# Patient Record
Sex: Female | Born: 1935 | Race: White | Hispanic: No | Marital: Married | Smoking: Never smoker
Health system: Southern US, Community
[De-identification: ages and names within clinical notes are randomized; demographics above are authoritative.]

## PROBLEM LIST (undated history)

## (undated) DIAGNOSIS — K219 Gastro-esophageal reflux disease without esophagitis: Secondary | ICD-10-CM

## (undated) DIAGNOSIS — Z531 Procedure and treatment not carried out because of patient's decision for reasons of belief and group pressure: Secondary | ICD-10-CM

## (undated) DIAGNOSIS — M1990 Unspecified osteoarthritis, unspecified site: Secondary | ICD-10-CM

## (undated) DIAGNOSIS — E785 Hyperlipidemia, unspecified: Secondary | ICD-10-CM

## (undated) DIAGNOSIS — I10 Essential (primary) hypertension: Secondary | ICD-10-CM

## (undated) DIAGNOSIS — N952 Postmenopausal atrophic vaginitis: Secondary | ICD-10-CM

## (undated) DIAGNOSIS — N3281 Overactive bladder: Secondary | ICD-10-CM

## (undated) DIAGNOSIS — IMO0001 Reserved for inherently not codable concepts without codable children: Secondary | ICD-10-CM

## (undated) HISTORY — DX: Unspecified osteoarthritis, unspecified site: M19.90

## (undated) HISTORY — PX: LUMBAR SPINE SURGERY: SHX701

## (undated) HISTORY — DX: Gastro-esophageal reflux disease without esophagitis: K21.9

## (undated) HISTORY — PX: VEIN LIGATION AND STRIPPING: SHX2653

## (undated) HISTORY — PX: COLONOSCOPY: SHX174

---
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
## 2000-03-23 ENCOUNTER — Encounter: Admission: RE | Admit: 2000-03-23 | Discharge: 2000-03-23 | Payer: Self-pay | Admitting: Otolaryngology

## 2000-03-23 ENCOUNTER — Encounter: Payer: Self-pay | Admitting: Otolaryngology

## 2002-07-25 ENCOUNTER — Other Ambulatory Visit: Admission: RE | Admit: 2002-07-25 | Discharge: 2002-07-25 | Payer: Self-pay | Admitting: Obstetrics and Gynecology

## 2002-11-26 ENCOUNTER — Encounter: Admission: RE | Admit: 2002-11-26 | Discharge: 2002-11-26 | Payer: Self-pay

## 2004-07-18 ENCOUNTER — Ambulatory Visit (HOSPITAL_COMMUNITY): Admission: RE | Admit: 2004-07-18 | Discharge: 2004-07-18 | Payer: Self-pay | Admitting: Oral Surgery

## 2004-09-27 ENCOUNTER — Other Ambulatory Visit: Admission: RE | Admit: 2004-09-27 | Discharge: 2004-09-27 | Payer: Self-pay | Admitting: Obstetrics and Gynecology

## 2005-06-21 ENCOUNTER — Encounter: Admission: RE | Admit: 2005-06-21 | Discharge: 2005-06-21 | Payer: Self-pay | Admitting: Obstetrics and Gynecology

## 2005-09-14 ENCOUNTER — Ambulatory Visit: Payer: Self-pay | Admitting: Ophthalmology

## 2005-10-02 ENCOUNTER — Ambulatory Visit: Payer: Self-pay | Admitting: Ophthalmology

## 2006-01-10 ENCOUNTER — Ambulatory Visit: Payer: Self-pay | Admitting: Otolaryngology

## 2007-06-25 ENCOUNTER — Encounter: Payer: Self-pay | Admitting: Orthopedic Surgery

## 2007-07-08 ENCOUNTER — Encounter: Payer: Self-pay | Admitting: Orthopedic Surgery

## 2007-08-07 ENCOUNTER — Encounter: Payer: Self-pay | Admitting: Orthopedic Surgery

## 2007-09-07 ENCOUNTER — Encounter: Payer: Self-pay | Admitting: Orthopedic Surgery

## 2010-02-14 ENCOUNTER — Emergency Department: Payer: Self-pay | Admitting: Internal Medicine

## 2010-12-07 ENCOUNTER — Emergency Department: Payer: Self-pay | Admitting: Emergency Medicine

## 2011-06-05 ENCOUNTER — Encounter: Payer: Self-pay | Admitting: Internal Medicine

## 2011-06-05 ENCOUNTER — Ambulatory Visit (INDEPENDENT_AMBULATORY_CARE_PROVIDER_SITE_OTHER): Payer: MEDICARE | Admitting: Internal Medicine

## 2011-06-05 VITALS — BP 136/75 | HR 88 | Temp 97.3°F | Ht 67.0 in | Wt 176.1 lb

## 2011-06-05 DIAGNOSIS — L03019 Cellulitis of unspecified finger: Secondary | ICD-10-CM

## 2011-06-05 NOTE — Assessment & Plan Note (Signed)
I do believe the Pseudomonas is colonization and not real based on no fever, no significant progression of cellulitis and that it has not developed an abscess.  I advised her that she may respond better to antifungal agents and was advised to continue with her ketaconazole.  I do not feel further antibacterials are of any benefit.  If it persists, I suggested she may need oral therapy with terbinfine, which also would be useful for her toes, but at this time, she prefers to continue with topical therapy. She was advised of signs of bacterial infection including significant erythema,edema and pain and spread or abscess formation.

## 2011-06-05 NOTE — Progress Notes (Signed)
  Subjective:    Patient ID: Kendra Flores, female    DOB: [birth year], 77 y.o.   MRN: [id number]  HPI patient with history of chronic fungal infections of toes and now feet and recent nail culture with Pseudomonas here for consultation regarding whether or not this constitutes colonization.  The patient reports many years of toe onchomycosis and recent chronic paronychia.  The patient reports washing her hands very frequently and recently has noted a fingernail infection in what appears to be an ingrown nail. I believe part of this nail is what was cultured and has grown several bacterial specimens and the patient has been on several courses of antibiotics.  She reports no fevers and some relief with ketaconazole.  Her finger does bother her and has been swollen.  This has been going on for several weeks.  No history of any immunodeficiency issues or diabetes.  She is otherwise active and healthy.     Review of Systems  Constitutional: Negative.   HENT: Negative.   Eyes: Negative.   Respiratory: Negative.   Cardiovascular: Negative.   Gastrointestinal: Negative.   Musculoskeletal: Negative.   Skin:       + recent rash after taking prednisone  Hematological: Bruises/bleeds easily.       Objective:   Physical Exam  Constitutional: She is oriented to person, place, and time. She appears well-developed and well-nourished.  Cardiovascular: Normal rate, regular rhythm and normal heart sounds.   No murmur heard. Pulmonary/Chest: Effort normal and breath sounds normal. No respiratory distress. She has no wheezes.  Neurological: She is alert and oriented to person, place, and time.  Skin: Skin is warm and dry.       Toenails with onchomycosis Fingernail ingrown, some fungal appearing changes, mild erythema.  Psychiatric: She has a normal mood and affect. Her behavior is normal.          Assessment & Plan:   No problem-specific assessment & plan notes found for this encounter.

## 2012-02-05 ENCOUNTER — Encounter: Payer: Self-pay | Admitting: Neurology

## 2012-03-29 ENCOUNTER — Ambulatory Visit (INDEPENDENT_AMBULATORY_CARE_PROVIDER_SITE_OTHER): Payer: MEDICARE | Admitting: Neurology

## 2012-03-29 ENCOUNTER — Encounter: Payer: Self-pay | Admitting: Neurology

## 2012-03-29 VITALS — BP 140/80 | HR 100 | Wt 184.0 lb

## 2012-03-29 DIAGNOSIS — M549 Dorsalgia, unspecified: Secondary | ICD-10-CM

## 2012-03-29 DIAGNOSIS — M48061 Spinal stenosis, lumbar region without neurogenic claudication: Secondary | ICD-10-CM

## 2012-03-29 MED ORDER — PREGABALIN 25 MG PO CAPS: 25.0000 mg | ORAL_CAPSULE | Freq: Three times a day (TID) | ORAL | Status: DC

## 2012-03-29 MED ORDER — DIAZEPAM 5 MG PO TABS: 2.5000 mg | ORAL_TABLET | Freq: Every day | ORAL | Status: AC

## 2012-03-29 NOTE — Patient Instructions (Signed)
Your MRI is scheduled for Monday, June 3rd at 12:45am at [facility] [address]  Please arrive by 12:15pm.  [phone]

## 2012-03-29 NOTE — Progress Notes (Signed)
Dear Dr. Harrington Challenger,  Thank you for having me see Kendra Flores in consultation today at [facility] for her problem with bilateral lower extremity pain.  As you may recall, she is a 78 y.o. year old female with a history of lumbar spine surgery for stenosis(2008), arthritis of the hips, cervical spondylosis, who presents with bilateral leg pain for several years.  It started after her lumbar spine surgery.  She says the pain is worse when walking, and is typically bilateral involving her thighs but radiates to her lower legs at times.  She says it is worse with movement, but does not endorse change with coughing.  She also gets a separate right upper leg pain.  She describes the pain as dull and aching, and does not endorse numbness or tingling.  She has had no change in her bladder function.  She has had a number of interventions, including anesthetic injection into her hip joint, which helped her pain immensely but only lasted days.  Steroid injection into the hip joint was not as successful.  She apparently has had injections into the back, the details of these are not known.  She has been tried on gabapentin, but this made her not feel well.  She has not been on Elavil or Lyrica.  She said she did have an EMG/NCS after her lumbar spine surgery,but does not know the results, and is pretty certain she did not have her current symptoms at the time.   She is unwilling to undergo EMG/NCS because of the pain it causes.  She also does not want to undergo further surgery.  She is using Excedrin and tramadol for the pain.  She was given Celecoxib but has not used it.  She has been seen by a pain clinic in the past, but this was for her migraine headaches.  She has not seen a pain doctor for her currently leg pains.  She also complains of neck pain and numbness in her arms, but she feels this is largely well controlled.  Past Medical History  Diagnosis Date  . Arthritis   . GERD (gastroesophageal  reflux disease)     No past surgical history on file.  History   Social History  . Marital Status: Married    Spouse Name: N/A    Number of Children: N/A  . Years of Education: N/A   Social History Main Topics  . Smoking status: Never Smoker   . Smokeless tobacco: Never Used  . Alcohol Use: No  . Drug Use: No  . Sexually Active: Yes -- Female partner(s)   Other Topics Concern  . None   Social History Narrative  . None    No family history on file.  Current Outpatient Prescriptions on File Prior to Visit  Medication Sig Dispense Refill  . aspirin 81 MG tablet Take 81 mg by mouth daily.        . fluticasone (FLONASE) 50 MCG/ACT nasal spray Place 2 sprays into the nose daily.      Marland Kitchen ketoconazole (NIZORAL) 2 % cream Apply 1 application topically daily.        . mupirocin (BACTROBAN) 2 % ointment Apply 1 application topically 2 (two) times daily.        Marland Kitchen omeprazole (PRILOSEC) 20 MG capsule Take 20 mg by mouth daily.        . TRAMADOL HCL PO Take by mouth.        . pregabalin (LYRICA) 25 MG capsule Take 1  capsule (25 mg total) by mouth 3 (three) times daily.  90 capsule  3    Allergies  Allergen Reactions  . Ciprofloxacin   . Strawberry       ROS:  13 systems were reviewed and are notable for no change in bladder function.  She also has significant problems sleeping because of leg pain, but does get some relief from 2.5mg  qhs of Valium which she takes intermittently.  All other review of systems are unremarkable.   Examination:  Filed Vitals:   03/29/12 0818  BP: 140/80  Pulse: 100  Weight: 184 lb (83.462 kg)     In general, uncomfortable women.  Cardiovascular: The patient has a regular rate and rhythm and no carotid bruits.  Fundoscopy:  Disks are flat. Vessel caliber within normal limits.  Mental status:   The patient is oriented to person, place and time. Recent and remote memory are intact. Attention span and concentration are normal. Language  including repetition, naming, following commands are intact. Fund of knowledge of current and historical events, as well as vocabulary are normal.  Cranial Nerves: Pupils are equally round and reactive to light. Visual fields full to confrontation. Extraocular movements are intact without nystagmus. Facial sensation and muscles of mastication are intact. Muscles of facial expression are symmetric. Hearing intact to bilateral finger rub. Tongue protrusion, uvula, palate midline.  Shoulder shrug intact  Motor:  The patient has normal bulk and tone, no pronator drift.  There are no adventitious movements.  5/5 muscle strength bilaterally.  Reflexes:   Biceps  Triceps Brachioradialis Knee Ankle  Right 3+  3+  3+   3+ 2+  Left  3+  3+  3+   3+ 2+  Toes down  Coordination:  Normal finger to nose.  No dysdiadokinesia.  Sensation was check in the lower extremities.  Perhaps a stocking loss to pin prick, decreased vibration, but no clear dermatomal pattern to her groin.  Gait and Station are antalgic.  Romberg is negative.   Impression/Recommendations:  1.  Leg pain/back pain - The big question is what are the generators of this pain.  Her response to an anesthetic injection into the right hip does suggest this may be part of the problem.  However, her history of lumbar stenosis s/p surgery suggests that she may be continuing to have problems with her lumbar spine, possibly causing a radiculopathy.  Normally I would get an EMG/NCS of her lower extremities, and then proceed to an MRI L-spine.  However, she is unwilling to get an EMG.  I will get an MRI L-spine to make sure there are no signs of nerve entrapment.  I have started her on a very low dose of Lyrica due to her known medication intolerances.  She can increase this to 25mg  tid.  I will also give her valium 2.5 mg qhs for sleep.   We will see the patient back in 3 months.  Thank you for having [place] see Kendra Flores in consultation.   Feel free to contact me with any questions.  Kendra Raider Modesto Charon, MD [facility], [place] [address] [place], [place] [address] Phone: [phone] Fax: [phone].

## 2012-04-01 DIAGNOSIS — M48061 Spinal stenosis, lumbar region without neurogenic claudication: Secondary | ICD-10-CM | POA: Insufficient documentation

## 2012-04-02 ENCOUNTER — Telehealth: Payer: Self-pay

## 2012-04-02 NOTE — Telephone Encounter (Signed)
Message copied by Lelon Huh on Tue Apr 02, 2012 11:50 AM ------      Message from: Milas Gain      Created: Mon Apr 01, 2012 12:15 PM       Quanda Pavlicek,            Could you let the [facility] know there is no MRI on the disks they gave me.  Therefore they are going to need to get the operative note for the hardware they had implanted before we can get a repeat MRI.            Matt

## 2012-04-02 NOTE — Telephone Encounter (Signed)
Pt aware, she will contact surgeon and have them fax a note to [place] regarding what type of hardware she has.

## 2012-04-08 ENCOUNTER — Ambulatory Visit
Admission: RE | Admit: 2012-04-08 | Discharge: 2012-04-08 | Disposition: A | Discharge status: Home / Self Care | Payer: Medicare Other | Source: Ambulatory Visit | Attending: Neurology | Admitting: Neurology

## 2012-04-08 DIAGNOSIS — M549 Dorsalgia, unspecified: Secondary | ICD-10-CM

## 2012-04-15 ENCOUNTER — Telehealth: Payer: Self-pay | Admitting: Neurology

## 2012-04-15 NOTE — Telephone Encounter (Signed)
The patient came by the office to pick up her medical records. Informed of the MRI results at that time. She also signed a release of information so that we could obtain notes re: ESI in her back by Dr. Mila Palmer at [facility].

## 2012-04-15 NOTE — Telephone Encounter (Signed)
see results note that I wrote.

## 2012-04-15 NOTE — Progress Notes (Signed)
Patient came by the office to pick up med records. Informed of the MRI results at that time. She also signed a release of information so that we could get notes from Dr. Fredonia Highland re: ESI to her back.

## 2012-04-15 NOTE — Telephone Encounter (Signed)
Pt would like results of MRI. She also states that Lyrica made her depressed and she doesn't want to take it. The 2.5 mg of Valium at night to sleep as needed is working. Pt would like a verbal report of MRI results as well as a hard copy sent to her. She asked about a CD as well, advised pt to contact [facility] regarding the actual disk.

## 2012-04-16 ENCOUNTER — Telehealth: Payer: Self-pay | Admitting: Neurology

## 2012-04-16 NOTE — Telephone Encounter (Signed)
Spoke with the patient. Information given as directed by Dr. Modesto Charon below. The patient was encouraged to follow up with her PCP re: additional imaging that may be needed. The patient understands. She also has a copy of the MRI report.

## 2012-04-16 NOTE — Telephone Encounter (Signed)
Message copied by Benay Spice on Tue Apr 16, 2012  9:07 AM ------      Message from: Milas Gain      Created: Tue Apr 16, 2012  8:41 AM       Hate to bother you with this. But it looks like Ms. Uecker has a cyst on her right kidney that I forgot to mention - these are commonly seen and usually benign but she should follow up with her PCP about this to see if they want to get any other imaging.

## 2012-06-05 ENCOUNTER — Ambulatory Visit (INDEPENDENT_AMBULATORY_CARE_PROVIDER_SITE_OTHER): Payer: Medicare Other | Admitting: Neurology

## 2012-06-05 ENCOUNTER — Encounter: Payer: Self-pay | Admitting: Neurology

## 2012-06-05 VITALS — BP 128/80 | HR 88 | Wt 187.0 lb

## 2012-06-05 DIAGNOSIS — G8929 Other chronic pain: Secondary | ICD-10-CM

## 2012-06-05 DIAGNOSIS — M48061 Spinal stenosis, lumbar region without neurogenic claudication: Secondary | ICD-10-CM

## 2012-06-05 MED ORDER — DIAZEPAM 5 MG PO TABS: 5.0000 mg | ORAL_TABLET | Freq: Every evening | ORAL | Status: AC | PRN

## 2012-06-05 MED ORDER — DIAZEPAM 5 MG PO TABS: 5.0000 mg | ORAL_TABLET | Freq: Four times a day (QID) | ORAL | Status: DC | PRN

## 2012-06-05 NOTE — Progress Notes (Signed)
Dear Dr. Harrington Challenger,  I saw  Kendra Flores back in [facility] clinic for her problem with bilateral lower extremity pain.  As you may recall, she is a 78 y.o. year old female with a history of lumbar spine surgery for stenosis(2008), arthritis of the hips, cervical spondylosis, who presents with bilateral leg pain for several years. It started after her lumbar spine surgery.   At her last visit, I ordered an MRI of her L-spine.  This revealed her previous decompression but also L2-L3 right mild to moderate foraminal stenosis and possible left L5 nerve root encroachment.  She tried the Lyrica 25mg  one night, but it made her feel too depressed the next day.  Apparently she has seen the [facility] in the past, with no relief.  She gets some relief from acupuncture.  She finds the Valium 2.5-5mg  qhs I prescribed helps her sleep.  She continues to get intermittent shooting pain down her right or left leg.  It sounds like the left leg pain does go to the foot at times.  She also gets lower extremity pain that she attributes to her sclerotherapy that she thinks is separate.  She refused to get a NCS or EMG.  She feels overall however her pain is better.   Medical history, social history, and family history were reviewed and have not changed since the last clinic visit.  Current Outpatient Prescriptions on File Prior to Visit  Medication Sig Dispense Refill  . brimonidine (ALPHAGAN P) 0.1 % SOLN       . fluticasone (FLONASE) 50 MCG/ACT nasal spray Place 2 sprays into the nose daily.      Marland Kitchen ketoconazole (NIZORAL) 2 % cream Apply 1 application topically daily.        . metaxalone (SKELAXIN) 800 MG tablet Take 800 mg by mouth. prn      . mupirocin (BACTROBAN) 2 % ointment Apply 1 application topically 2 (two) times daily.        Marland Kitchen omeprazole (PRILOSEC) 20 MG capsule Take 20 mg by mouth daily.        . TRAMADOL HCL PO Take by mouth.          Allergies  Allergen Reactions  .  Ciprofloxacin   . Strawberry     ROS:  13 systems were reviewed and are notable for chronic hip and neck pain as well.  All other review of systems are unremarkable.  Exam: . Filed Vitals:   06/05/12 1103  BP: 128/80  Pulse: 88  Weight: 187 lb (84.823 kg)    In general, well appearing women.    Motor:  Normal bulk and tone, no drift and 5/5 muscle strength bilaterally.  Reflexes:  Biceps Triceps Brachioradialis Knee Ankle  Right 3+ 3+ 3+ 3+ 2+  Left 3+ 3+ 3+ 3+ 2+   Gait:  Normal gait and station.  Romberg negative.  Impression/Recommendations:  1.  Shooting leg pain - likely related to her bilateral possible radiculopathies.  I offered Pamelor but she is not willing to try this at this time.  I think returning to the [facility] for perhaps another [facility] with the additional information from her MRI L-spine may be helpful. 2.  Difficulty sleeping - I have refilled her Valium 2.5->5mg  qhs.  This is likely acting as a muscle relaxant as well.   I will be leaving [facility] to join the Epilepsy Section at [facility] so I am going to let her follow up  with you.  Kendra Raider Modesto Charon, MD [facility], [place]

## 2013-07-14 ENCOUNTER — Ambulatory Visit: Payer: Self-pay | Admitting: Orthopedic Surgery

## 2013-07-14 LAB — BASIC METABOLIC PANEL
Anion Gap: 3 — ABNORMAL LOW (ref 7–16)
BUN: 21 mg/dL — ABNORMAL HIGH (ref 7–18)
Calcium, Total: 9.9 mg/dL (ref 8.5–10.1)
Chloride: 106 mmol/L (ref 98–107)
Co2: 29 mmol/L (ref 21–32)
Creatinine: 0.75 mg/dL (ref 0.60–1.30)
EGFR (African American): >60
Glucose: 104 mg/dL — ABNORMAL HIGH (ref 65–99)
Osmolality: 279 (ref 275–301)
Potassium: 4.3 mmol/L (ref 3.5–5.1)

## 2013-07-14 LAB — URINALYSIS, COMPLETE
Glucose,UR: NEGATIVE mg/dL (ref 0–75)
Ketone: NEGATIVE
Ph: 5 (ref 4.5–8.0)
Specific Gravity: 1.011 (ref 1.003–1.030)
Squamous Epithelial: 6

## 2013-07-14 LAB — PROTIME-INR
INR: 0.9
Prothrombin Time: 12.4 secs (ref 11.5–14.7)

## 2013-07-14 LAB — CBC
HCT: 40.5 % (ref 35.0–47.0)
HGB: 13.6 g/dL (ref 12.0–16.0)
MCH: 32 pg (ref 26.0–34.0)
MCV: 95 fL (ref 80–100)

## 2013-07-28 ENCOUNTER — Inpatient Hospital Stay: Payer: Self-pay | Admitting: Orthopedic Surgery

## 2013-07-28 HISTORY — PX: TOTAL HIP ARTHROPLASTY: SHX124

## 2013-07-29 LAB — BASIC METABOLIC PANEL
BUN: 9 mg/dL (ref 7–18)
Chloride: 107 mmol/L (ref 98–107)
Co2: 28 mmol/L (ref 21–32)
Creatinine: 0.69 mg/dL (ref 0.60–1.30)
EGFR (Non-African Amer.): >60
Glucose: 134 mg/dL — ABNORMAL HIGH (ref 65–99)
Osmolality: 278 (ref 275–301)

## 2013-07-29 LAB — HEMOGLOBIN: HGB: 11.8 g/dL — ABNORMAL LOW (ref 12.0–16.0)

## 2013-07-29 LAB — PLATELET COUNT: Platelet: 257 10*3/uL (ref 150–440)

## 2013-07-31 ENCOUNTER — Encounter: Payer: Self-pay | Admitting: Internal Medicine

## 2013-07-31 LAB — PATHOLOGY REPORT

## 2013-08-06 ENCOUNTER — Encounter: Payer: Self-pay | Admitting: Internal Medicine

## 2014-01-29 ENCOUNTER — Ambulatory Visit: Payer: Self-pay | Admitting: Orthopedic Surgery

## 2014-01-29 LAB — URINALYSIS, COMPLETE
BACTERIA: NONE SEEN
BLOOD: NEGATIVE
Bilirubin,UR: NEGATIVE
Glucose,UR: NEGATIVE mg/dL (ref 0–75)
NITRITE: NEGATIVE
PH: 5 (ref 4.5–8.0)
Protein: NEGATIVE
RBC,UR: 6 /HPF (ref 0–5)
Specific Gravity: 1.031 (ref 1.003–1.030)
Squamous Epithelial: 2
WBC UR: 3 /HPF (ref 0–5)

## 2014-01-29 LAB — CBC
HCT: 38.8 % (ref 35.0–47.0)
HGB: 12.4 g/dL (ref 12.0–16.0)
MCH: 31 pg (ref 26.0–34.0)
MCHC: 32.1 g/dL (ref 32.0–36.0)
MCV: 97 fL (ref 80–100)
PLATELETS: 227 10*3/uL (ref 150–440)
RBC: 4.02 10*6/uL (ref 3.80–5.20)
RDW: 14.8 % — ABNORMAL HIGH (ref 11.5–14.5)
WBC: 6 10*3/uL (ref 3.6–11.0)

## 2014-01-29 LAB — BASIC METABOLIC PANEL
Anion Gap: 4 — ABNORMAL LOW (ref 7–16)
BUN: 24 mg/dL — ABNORMAL HIGH (ref 7–18)
CHLORIDE: 108 mmol/L — AB (ref 98–107)
CO2: 29 mmol/L (ref 21–32)
CREATININE: 0.74 mg/dL (ref 0.60–1.30)
Calcium, Total: 9.8 mg/dL (ref 8.5–10.1)
EGFR (African American): >60
EGFR (Non-African Amer.): >60
GLUCOSE: 97 mg/dL (ref 65–99)
Osmolality: 285 (ref 275–301)
Potassium: 3.6 mmol/L (ref 3.5–5.1)
Sodium: 141 mmol/L (ref 136–145)

## 2014-01-29 LAB — MRSA PCR SCREENING

## 2014-01-29 LAB — SEDIMENTATION RATE: ERYTHROCYTE SED RATE: 15 mm/h (ref 0–30)

## 2014-01-29 LAB — APTT: ACTIVATED PTT: 30.9 s (ref 23.6–35.9)

## 2014-01-29 LAB — PROTIME-INR
INR: 1
PROTHROMBIN TIME: 13 s (ref 11.5–14.7)

## 2014-02-12 ENCOUNTER — Inpatient Hospital Stay: Payer: Self-pay | Admitting: Orthopedic Surgery

## 2014-02-12 HISTORY — PX: TOTAL HIP ARTHROPLASTY: SHX124

## 2014-02-13 LAB — BASIC METABOLIC PANEL
Anion Gap: 3 — ABNORMAL LOW (ref 7–16)
BUN: 11 mg/dL (ref 7–18)
CALCIUM: 8.3 mg/dL — AB (ref 8.5–10.1)
CHLORIDE: 108 mmol/L — AB (ref 98–107)
Co2: 28 mmol/L (ref 21–32)
Creatinine: 0.57 mg/dL — ABNORMAL LOW (ref 0.60–1.30)
EGFR (African American): >60
GLUCOSE: 120 mg/dL — AB (ref 65–99)
OSMOLALITY: 278 (ref 275–301)
Potassium: 3.9 mmol/L (ref 3.5–5.1)
Sodium: 139 mmol/L (ref 136–145)

## 2014-02-13 LAB — HEMOGLOBIN: HGB: 10.8 g/dL — ABNORMAL LOW (ref 12.0–16.0)

## 2014-02-13 LAB — PLATELET COUNT: Platelet: 177 10*3/uL (ref 150–440)

## 2014-02-14 LAB — BASIC METABOLIC PANEL
ANION GAP: 3 — AB (ref 7–16)
BUN: 11 mg/dL (ref 7–18)
Calcium, Total: 8.2 mg/dL — ABNORMAL LOW (ref 8.5–10.1)
Chloride: 109 mmol/L — ABNORMAL HIGH (ref 98–107)
Co2: 28 mmol/L (ref 21–32)
Creatinine: 0.54 mg/dL — ABNORMAL LOW (ref 0.60–1.30)
EGFR (African American): >60
Glucose: 117 mg/dL — ABNORMAL HIGH (ref 65–99)
OSMOLALITY: 280 (ref 275–301)
Potassium: 3.4 mmol/L — ABNORMAL LOW (ref 3.5–5.1)
SODIUM: 140 mmol/L (ref 136–145)

## 2014-02-14 LAB — HEMOGLOBIN: HGB: 9.7 g/dL — ABNORMAL LOW (ref 12.0–16.0)

## 2014-02-15 LAB — POTASSIUM: Potassium: 4.1 mmol/L (ref 3.5–5.1)

## 2014-02-15 LAB — HEMOGLOBIN: HGB: 9.7 g/dL — ABNORMAL LOW (ref 12.0–16.0)

## 2014-02-16 LAB — PATHOLOGY REPORT

## 2015-01-01 ENCOUNTER — Emergency Department: Payer: Self-pay | Admitting: Emergency Medicine

## 2015-01-29 HISTORY — PX: INGUINAL HERNIA REPAIR: SUR1180

## 2015-02-26 NOTE — Discharge Summary (Signed)
PATIENT NAME:  Kendra Flores, PARMA MR#:  [id number] DATE OF BIRTH:  [birth year]  DATE OF ADMISSION:  07/28/2013 DATE OF DISCHARGE:  07/31/2013  ADMITTING DIAGNOSIS: Right hip severe osteoarthritis.   DISCHARGE DIAGNOSIS: Right hip severe osteoarthritis.   PROCEDURE: Right total hip replacement, right anterior approach.   ANESTHESIA: General.   SURGEON: Leitha Schuller, M.D.   ASSISTANT: Cranston Neighbor, PA-C.   ESTIMATED BLOOD LOSS: 200 mL, which was filtered back and given through Cell Saver.   SPECIMEN: Removed femoral head with extensive wear.  IMPLANTS: Medacta Amis 4 stem with a 52 Versafit cup, DM with corresponding liner and a M-28 mm head.   CONDITION: To recovery room stable.   HISTORY: The patient is a 81 year old female who has been active. She does have a history of prior multiple level lumbar decompression by Dr. Barbie Banner at [facility] in 2008. She has had intermittent leg pain related to this. She has been seeing Dr. Yves Dill for the last few years after referral to him. She has had hip injections as well as back treatment. She has gotten to the point now where her hip has been debilitating. She has had significant progression of her osteoarthritis. She brings in copies of CDs performed from 2010 which show significant osteoarthritis, but there is maintenance of the joint space. Additionally, she reports pain at night that wakes her up with severe pain that causes her to scream. The right hip is more severe than the left, and she does have significant left hip pain and swelling.   PHYSICAL EXAMINATION: HEENT: Remarkable for upper dental appliances.  LUNGS: Clear.  HEART: Regular rate and rhythm.  RIGHT HIP: On exam she is proximally 0 degrees of internal rotation bilaterally, 10 degrees external rotation with severe pain. She has flexion contracture of about 20 degrees bilaterally. Distally she is neurovascularly intact. She is intact about the hip.   HOSPITAL COURSE: The  patient was admitted to the hospital on 07/28/2013. She had surgery that same day and was brought to the orthopedic floor from the PAC-U in stable condition. On postop day 1, the patient's hemoglobin was stable and vital signs were stable. She had slow progress with physical therapy. On postop day 2, she continued to have slow progress with physical therapy, labs and vital signs remained stable, and by postop day 3 she continued to be stable medically, but having slow progress with physical therapy so she was discharged to rehab facility for continuation of physical therapy and occupational therapy.   CONDITION AT DISCHARGE: Stable.   DISCHARGE INSTRUCTIONS: The patient may gradually increase weight-bearing on the affected extremity. She is to wear thigh-high TED hose on both legs and remove 1 hour per 8 hour shift. Use incentive spirometry every hour while awake and encourage cough and deep breathing. No concentrated sweets or sugar. Apply an ice pack to the affected area. Do not get the dressing or bandage wet or dirty. Call [facility] ortho if the dressing gets water under it. Leave the dressing on. Call [facility] ortho if any of the following occur: Bright red bleeding from the incision or wound, fever above 101.5 degrees, redness, swelling or drainage at the incision. Call [facility] ortho if you experience any increased leg pain, numbness or weakness and/or bowel or bladder symptoms. Physical therapy and occupational therapy has been ordered. Call [facility] ortho for follow-up appointment in 2 weeks.   DISCHARGE MEDICATIONS: Continue home medications at discharge and then hospital medications. Home care  will be: OxyContin extended release 50 mg oral tablet 1 tab every 12 hours, oxycodone 5 mg oral tablet 1 to 2 tabs orally every 4 to 6 hours as needed for pain, tramadol 50 mg oral tablet 1 to 2 tabs orally every 6 hours as needed for pain, and an aspirin 81 mg oral delayed release  tablet 1 tablet orally once a day.  ____________________________ T. Cranston Neighborhris Gaines, PA-C tcg:sb D: 07/31/2013 07:37:08 ET T: 07/31/2013 08:07:34 ET JOB#: [id number]  cc: T. Cranston Neighborhris Gaines, PA-C, <Dictator> Evon SlackHOMAS C GAINES GeorgiaPA ELECTRONICALLY SIGNED 08/13/2013 9:06

## 2015-02-26 NOTE — Op Note (Signed)
PATIENT NAME:  Kendra Flores, [name] MR#:  [id number] DATE OF BIRTH:  01-16-1932  DATE OF PROCEDURE:  07/28/2013  PREOPERATIVE DIAGNOSIS: Severe osteoarthritis, right hip.   POSTOPERATIVE DIAGNOSIS: Severe osteoarthritis, right hip.   PROCEDURE: Right total hip replacement, direct anterior approach.   ANESTHESIA: General.   SURGEON: Leitha SchullerMichael J. Daisy Lites, M.D.   ASSISTANT: Cranston Neighborhris Gaines, PA-C.   DESCRIPTION OF PROCEDURE: The patient was brought to the operating room and after adequate spinal anesthesia was obtained, the patient was transferred to the operative table with the Medacta. Left leg on a well-padded table, right foot in the Medacta attachment and traction applied. Anterior C-arm views were obtained with slight traction, trying to restore normal anatomy radiographically. The hip was then prepped and draped in the usual sterile manner. Appropriate patient identification and timeout procedures were completed. A direct anterior approach was made with incision centered over the greater trochanter in line with the tensor fascia muscle. Incision was carried down through the skin and subcutaneous tissue. The patient is a TEFL teacherJehovah's Witness and extreme care was taken to obtain hemostasis throughout the procedure, Cell Saver being utilized as well. The tensor fascia was incised and the TFL retracted laterally. Deep fascia was then incised and the lateral femoral circumflex vessels were fairly small, were identified and cauterized. The rectus was retracted slightly medially and the anterior capsule exposed. A capsulotomy was created and the femoral neck cut carried out. The head was removed and noted to have extreme wear present with essentially no cartilage. There was extensive wear of the acetabulum as well. The labrum was excised and reaming was carried out to 52 mm, which gave good bleeding bone. A 52 mm trial was stable and a 52 cup was impacted without difficulty. The leg was externally rotated and the  pubofemoral and ischiofemoral release was carried out to mobilize the proximal femur. The proximal canal was opened with a box osteotome followed by broaches up to a size 4. The 4 seemed to give a tight fit and was chosen and trials were made off of this. The final stem was impacted with a medium head and a corresponding DM liner to the 52 mm cup. The hip was reduced and was very stable to 90 degrees rotation. Leg length was same as preop with traction, felt to be the appropriate length for the patient. The wound was thoroughly irrigated. Local anesthetic was infiltrated in the pericapsular tissue, 0.5% Sensorcaine without epinephrine. The tensor fascia was repaired using  running quill suture followed by a subcutaneous drain, 2-0 quill subcutaneously followed by skin staples, Xeroform, 4 x 4's, ABD, tape, and the patient was sent to the recovery room in stable condition.   ESTIMATED BLOOD LOSS: 200 mL, which was filtered back, given back through Cell Saver.   SPECIMEN: Removed femoral head with extensive wear.   IMPLANTS: Medacta AMIS 4 stem with a 52 Versafitcup DM with corresponding liner and an M28 mm head.   CONDITION: To recovery room, stable.    ____________________________ Leitha SchullerMichael J. Dara Beidleman, MD mjm:jm D: 07/28/2013 18:14:15 ET T: 07/28/2013 21:19:09 ET JOB#: [id number]  cc: Leitha SchullerMichael J. Ivyanna Sibert, MD, <Dictator> Leitha SchullerMICHAEL J Yossef Gilkison MD ELECTRONICALLY SIGNED 07/28/2013 23:37

## 2015-02-27 NOTE — Discharge Summary (Signed)
PATIENT NAME:  Kendra Flores, Kendra Flores MR#:  [id number] DATE OF BIRTH:  Apr 01, 1932  DATE OF ADMISSION:  02/12/2014 DATE OF DISCHARGE:  02/16/2014  ADMITTING DIAGNOSIS: Severe left hip osteoarthritis.   DISCHARGE DIAGNOSIS: Severe left hip osteoarthritis.   PROCEDURE: Left total hip replacement.   ANESTHESIA: General.   SURGEON: Leitha Schuller, M.D.   ESTIMATED BLOOD LOSS: 325 with 165 reinfusion from Cell Saver.   SPECIMEN: Removed femoral head.   COMPLICATIONS: None.   IMPLANTS:  Medacta AMIStem size 4, 54 mm Versafitcup DM with associated liner and an L 28 mm head.   CONDITION: To recovery room stable.   HISTORY: The patient is a 81 year old female who had a successful right total hip replacement in the past. She has had severe left hip osteoarthritis. The pain has been severe and limiting her activities of daily living. Pain is located in her groin and radiates into the top of the knee. She had severe pain with rotation. She has agreed and consented to a left total hip replacement.   PHYSICAL EXAMINATION: GENERAL: Well-developed, well-nourished female in no apparent distress. Normal affect. Antalgic component to left lower extremity.  LEFT HIP: Examination of the left hip shows the patient has severe pain with internal rotation. She has stiffness with internal and external rotation, strength is 4 to 5 with hip adduction and abduction. She has 5/5 strength with knee flexion and extension and ankle plantarflexion and dorsiflexion,  HEART: Regular rate and rhythm.  LUNGS: Clear to auscultation bilaterally. No wheezes, rales or rhonchi.  HEENT: Head normocephalic, atraumatic. Pupils equal, round and reactive to light.   HOSPITAL COURSE: The patient was admitted to the hospital on 02/12/2014. She had surgery that same day and was brought to the orthopedic floor from the PACU in stable condition. On postop day 1, the patient had acute postop blood loss anemia with a hemoglobin of 10.8. On  postoperative day 2, this trended down to 9.7 and was stable again on postop day 3 at 9.7. The patient had slow progress with physical therapy and by postop day 4, she had progressed well, but was still having difficulty getting in and out of the bed, as well as with getting to the bathroom. She had no assistance at home, so it was thought that she would be more safe if she  went to a rehab facility for PT and OT.   CONDITION AT DISCHARGE: Stable.   DISCHARGE INSTRUCTIONS: The patient may gradually increase weight-bearing on the affected extremity. Elevate the leg, apply TED hose on both legs and remove when arising the next morning. Elevate the heels off the bed, use incentive spirometer every hour while awake. I encouraged cough and deep breathing. The patient may resume a regular diet as tolerated. Apply an ice pack to the affected area. Do not get the dressing or bandage wet or dirty. Call [facility] ortho if  the dressing gets water under it. Leave the dressing on. Call [facility] ortho if any of the following occur: Bright red bleeding from the incision wound, fever above 101.5 degrees, redness, swelling or drainage at the incision. Call [facility] ortho if you experience any increased leg pain, numbness or weakness in the legs or bowel or bladder symptoms. Home physical therapy has been arranged for continuation of rehab program. Please call [facility] ortho if a therapist has not contacted you within 48 hours of your return home. Call [facility]  ortho for followup appointment at [phone].   Please see discharge medications from  discharge instructions.   ____________________________ T. Cranston Neighborhris Lucie Friedlander, PA-C tcg:dmm D: 02/16/2014 12:46:03 ET T: 02/16/2014 13:09:48 ET JOB#: [id number]  cc: T. Cranston Neighborhris Rye Dorado, PA-C, <Dictator> Evon SlackHOMAS C Kayonna Lawniczak GeorgiaPA ELECTRONICALLY SIGNED 02/27/2014 8:07

## 2015-02-27 NOTE — Op Note (Signed)
PATIENT NAME:  Kendra Flores, Kendra Flores DATE OF BIRTH:  [birth year]  DATE OF PROCEDURE:  02/12/2014  PREOPERATIVE DIAGNOSIS: Severe left hip osteoarthritis.   POSTOPERATIVE DIAGNOSIS:  Severe left hip osteoarthritis.   PROCEDURE: Left total hip replacement.   ANESTHESIA: General.   SURGEON:  Leitha SchullerMichael J. Quetzaly Ebner, MD    DESCRIPTION OF PROCEDURE: The patient was brought to the operating room, and after adequate anesthesia was obtained, the patient was placed on the operative table with the right leg on a well-padded table, left foot in the Medacta boot attachment. C-arm was brought in and good visualization of the proximal femur could be obtained on the AP and printed off.  Next, the hip was prepped and draped using the standard fashion. Appropriate patient identification and timeout procedures were completed. Direct anterior approach was made centered over the greater trochanter. The incision was carried down through the skin and subcutaneous tissue with hemostasis being achieved with electrocautery. The tensor fascia was incised and the tensor muscle retracted laterally. Deep retractor placed in the lateral femoral circumflex. Vessels ligated with minimal bleeding present. Next, the anterior capsule was exposed and a flap created for placement of the Charnley retractor, but first, Hohmann retractors placed around the femoral neck and a femoral neck cut carried out, then the head removed. There was severe degenerative change present to the femoral head. The labrum was excised along with extensive scar tissue within the acetabulum which had extensive wear. Reaming was carried out to 54 mm, at which point there was good bleeding bone. The 54 trial fit well and the 54 Versafitcup DM was then impacted into place. Following this, the leg was externally rotated.  Pubofemoral and ischiofemoral ligaments released and the leg was brought into external rotation and the foot dropped into extension. Sequential  broaching was carried out for the femur up to a size  which seemed to give a good fill.  Next, the trials were placed, and after appropriate length was determined based on comparison to preop x-ray, the #4 stem was impacted with an L head, 28 mm head, and the liner for the 54 cup.  The hip was reduced and appeared stable, appropriate component position. The wound was thoroughly irrigated and closed with a heavy quill suture for the deep fascia with 30 mL of 0.25% Sensorcaine with epinephrine infiltrated first. A subcutaneous drain was placed, followed by subcuticular quill suture and skin staples. Xeroform, 4 x 4's, ABDs, and tape were applied.   ESTIMATED BLOOD LOSS: 325, with 165 reinfused from Cell Saver.   SPECIMEN: Removed femoral head.   COMPLICATIONS: None.   IMPLANTS: Medacta AMIstem size 4, 54 mm Versafitcup DM with associated liner and an L 28 mm head.   CONDITION: To recovery room, stable.     ____________________________ Leitha SchullerMichael J. Miracle Criado, MD mjm:dd D: 02/12/2014 21:06:39 ET T: 02/12/2014 23:06:36 ET JOB#: [id number]  cc: Leitha SchullerMichael J. Colston Pyle, MD, <Dictator> Leitha SchullerMICHAEL J Akshay Spang MD ELECTRONICALLY SIGNED 02/13/2014 7:54

## 2015-05-03 IMAGING — CR DG C-ARM 61-120 MIN
4 series · 4 of 4 positions shown · non-contrast
Comparison: None

CLINICAL DATA: Hip arthroplasty.

EXAM:
DG C-ARM 61-120 MIN
:

[cont. (1 of 4)]
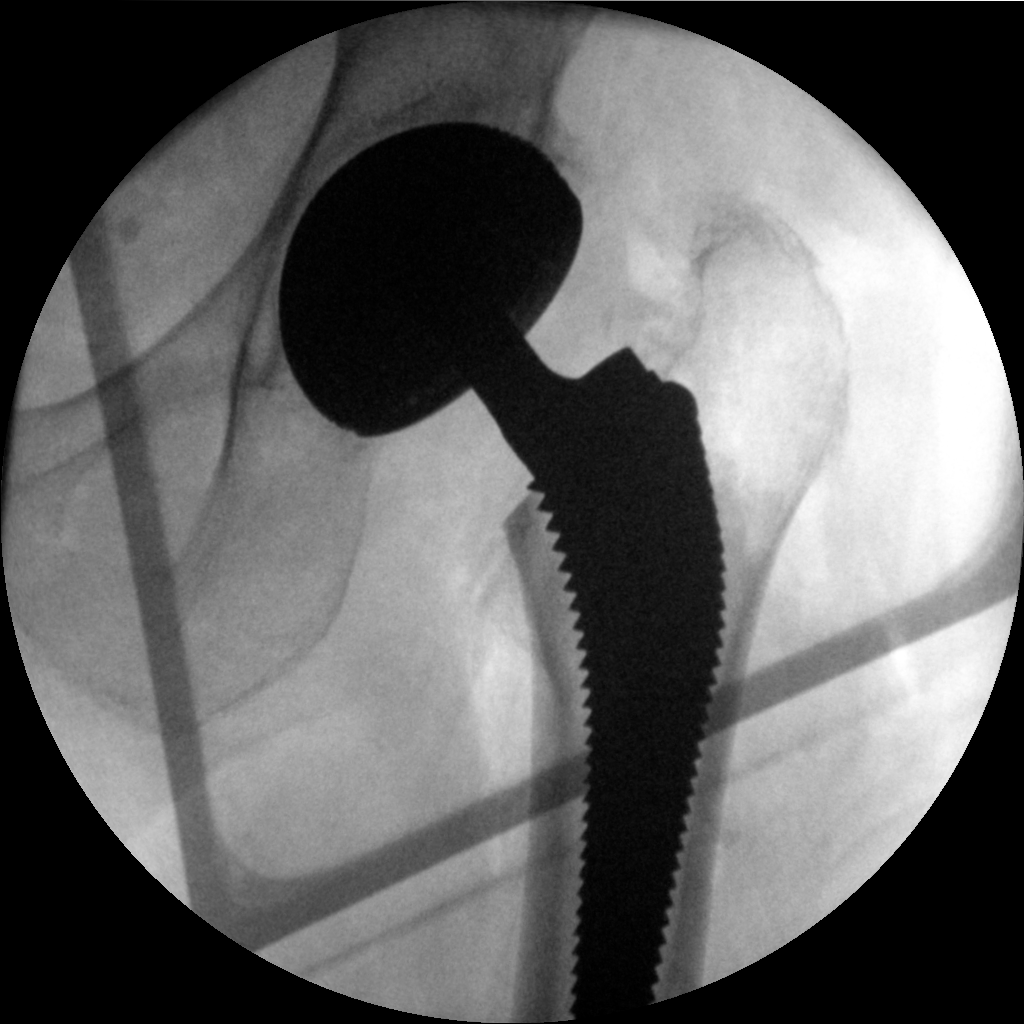

[cont. (2 of 4)]
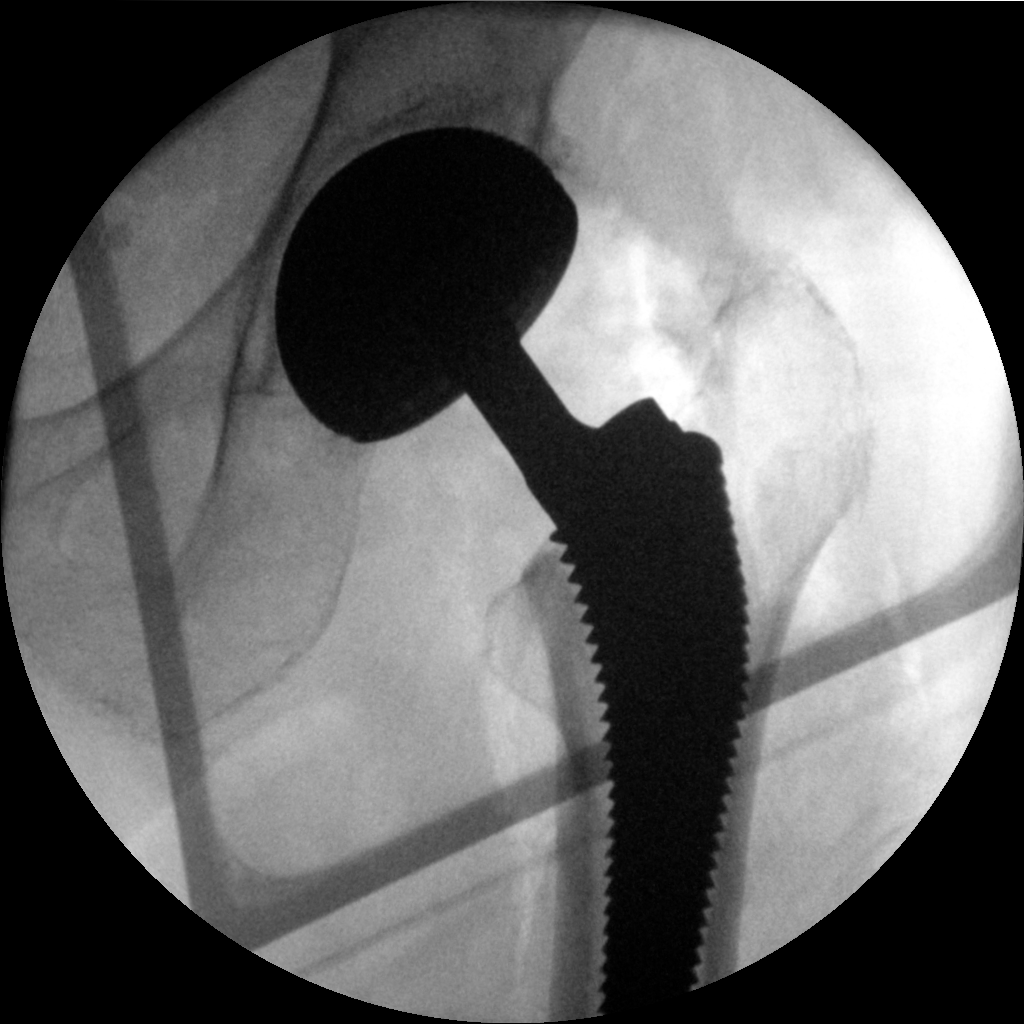

[cont. (3 of 4)]
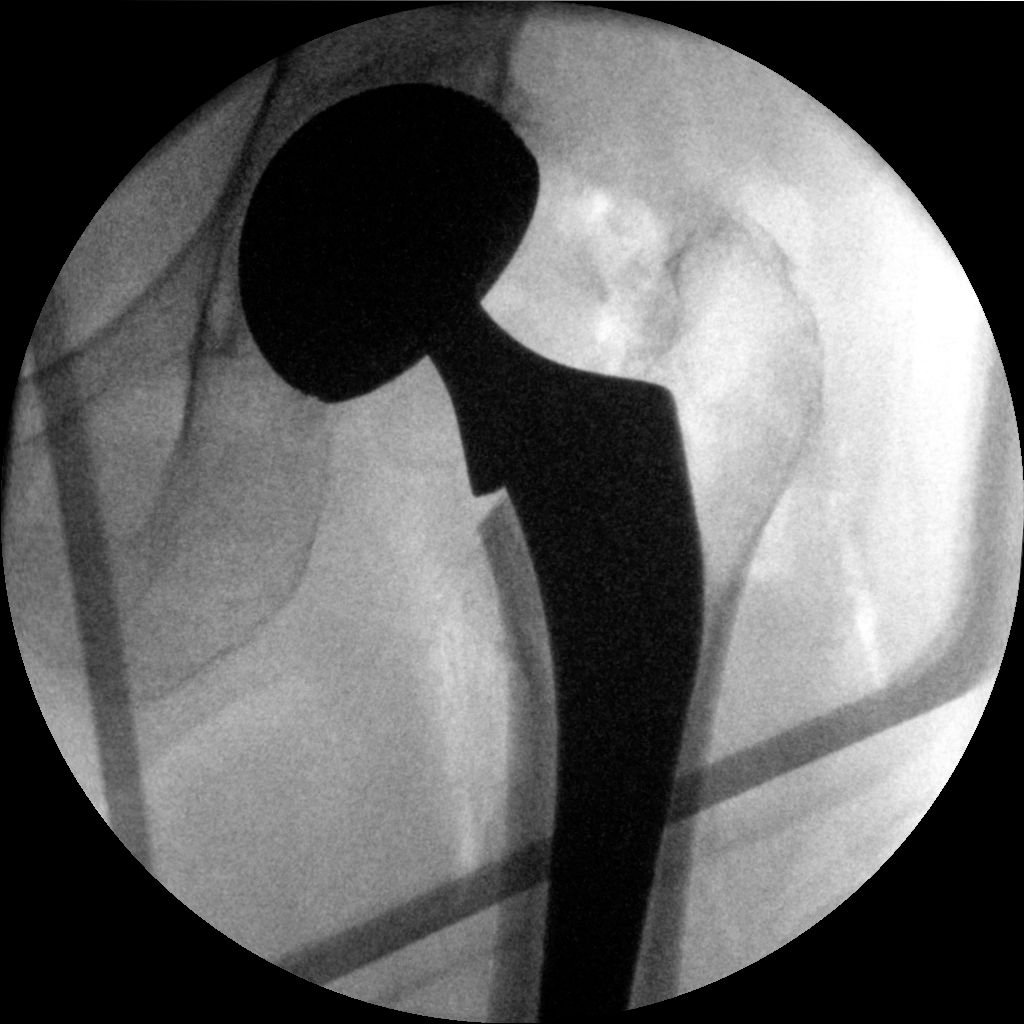

[cont. (4 of 4)]
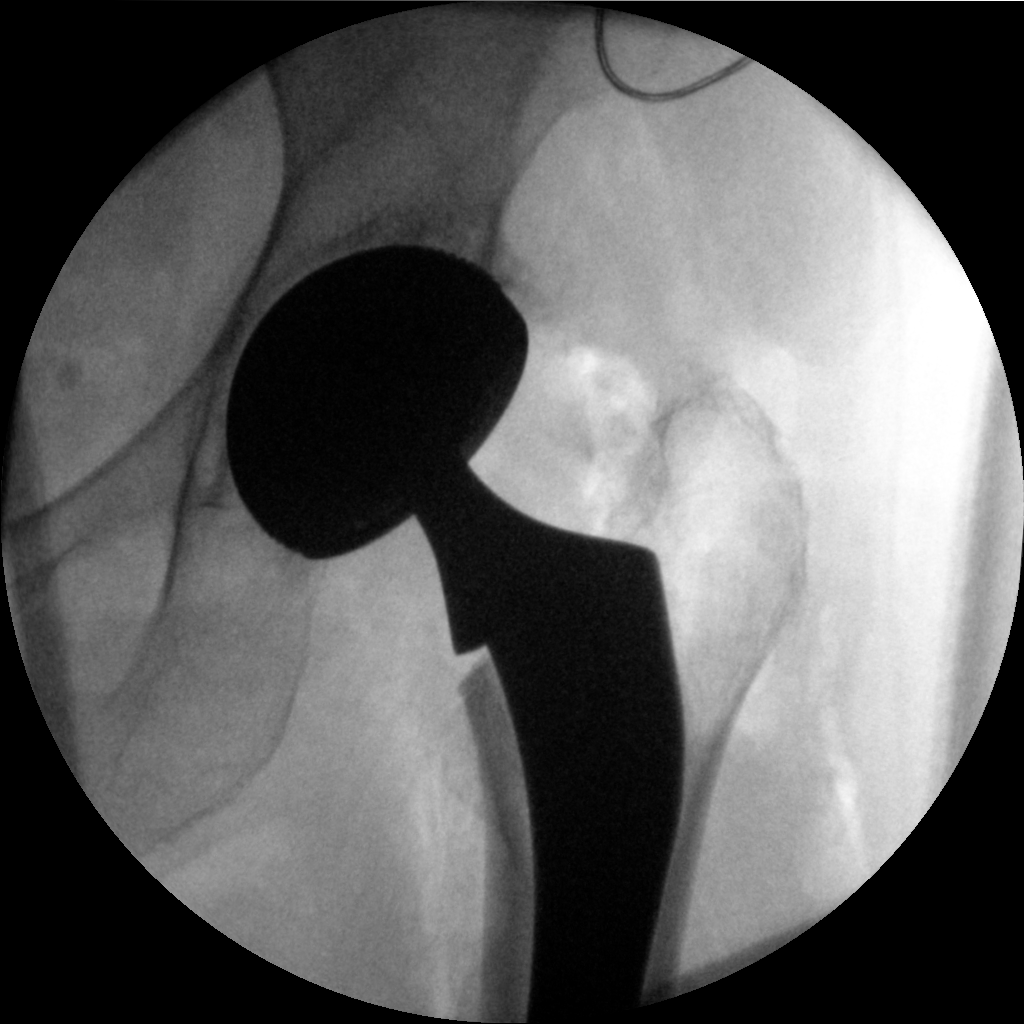

[4 of 4 positions shown; findings below may reference images not displayed]

FINDINGS: Left hip prosthesis appears well seated and aligned on the 4
submitted images. No acute fracture or evidence of an operative
complication.
IMPRESSION: Fluoroscopy for left hip arthroplasty. Please refer to the procedure
report for complete description.

## 2015-10-24 ENCOUNTER — Emergency Department: Payer: PPO

## 2015-10-24 ENCOUNTER — Emergency Department
Admission: EM | Admit: 2015-10-24 | Discharge: 2015-10-24 | Disposition: A | Discharge status: Home / Self Care | Payer: PPO | Attending: Student | Admitting: Student

## 2015-10-24 DIAGNOSIS — Z79899 Other long term (current) drug therapy: Secondary | ICD-10-CM | POA: Insufficient documentation

## 2015-10-24 DIAGNOSIS — Z792 Long term (current) use of antibiotics: Secondary | ICD-10-CM | POA: Diagnosis not present

## 2015-10-24 DIAGNOSIS — R55 Syncope and collapse: Secondary | ICD-10-CM | POA: Diagnosis not present

## 2015-10-24 DIAGNOSIS — N390 Urinary tract infection, site not specified: Secondary | ICD-10-CM | POA: Diagnosis not present

## 2015-10-24 DIAGNOSIS — Z7951 Long term (current) use of inhaled steroids: Secondary | ICD-10-CM | POA: Insufficient documentation

## 2015-10-24 LAB — URINALYSIS COMPLETE WITH MICROSCOPIC (ARMC ONLY)
Bilirubin Urine: NEGATIVE
GLUCOSE, UA: NEGATIVE mg/dL
HGB URINE DIPSTICK: NEGATIVE
KETONES UR: NEGATIVE mg/dL
Nitrite: NEGATIVE
PH: 6 (ref 5.0–8.0)
Protein, ur: NEGATIVE mg/dL
SPECIFIC GRAVITY, URINE: 1.013 (ref 1.005–1.030)

## 2015-10-24 LAB — CBC
HEMATOCRIT: 38.3 % (ref 35.0–47.0)
Hemoglobin: 12.7 g/dL (ref 12.0–16.0)
MCH: 32.5 pg (ref 26.0–34.0)
MCHC: 33.2 g/dL (ref 32.0–36.0)
MCV: 98.1 fL (ref 80.0–100.0)
PLATELETS: 191 10*3/uL (ref 150–440)
RBC: 3.91 MIL/uL (ref 3.80–5.20)
RDW: 14.9 % — AB (ref 11.5–14.5)
WBC: 7.7 10*3/uL (ref 3.6–11.0)

## 2015-10-24 LAB — BASIC METABOLIC PANEL
Anion gap: 7 (ref 5–15)
BUN: 26 mg/dL — AB (ref 6–20)
CHLORIDE: 109 mmol/L (ref 101–111)
CO2: 24 mmol/L (ref 22–32)
CREATININE: 0.71 mg/dL (ref 0.44–1.00)
Calcium: 9.2 mg/dL (ref 8.9–10.3)
GFR calc Af Amer: >60 mL/min (ref >60)
GFR calc non Af Amer: >60 mL/min (ref >60)
GLUCOSE: 129 mg/dL — AB (ref 65–99)
POTASSIUM: 4.1 mmol/L (ref 3.5–5.1)
SODIUM: 140 mmol/L (ref 135–145)

## 2015-10-24 LAB — GLUCOSE, CAPILLARY: GLUCOSE-CAPILLARY: 118 mg/dL — AB (ref 65–99)

## 2015-10-24 LAB — HEPATIC FUNCTION PANEL
ALBUMIN: 3.7 g/dL (ref 3.5–5.0)
ALT: 17 U/L (ref 14–54)
AST: 26 U/L (ref 15–41)
Alkaline Phosphatase: 46 U/L (ref 38–126)
BILIRUBIN INDIRECT: 0.6 mg/dL (ref 0.3–0.9)
Bilirubin, Direct: 0.2 mg/dL (ref 0.1–0.5)
TOTAL PROTEIN: 6.2 g/dL — AB (ref 6.5–8.1)
Total Bilirubin: 0.8 mg/dL (ref 0.3–1.2)

## 2015-10-24 LAB — TROPONIN I

## 2015-10-24 MED ORDER — CEPHALEXIN 500 MG PO CAPS: 500.0000 mg | ORAL_CAPSULE | Freq: Four times a day (QID) | ORAL | Status: DC

## 2015-10-24 NOTE — ED Notes (Signed)
[place] EMS from church after near syncopable episode.  Pt c/o being nauseated numerous times. Was given zofran 4mg  IV by EMS and 250cc NS as well.  Pt reports not feeling well this morning while sitting in church and felt faint.  Pt reports when she woke up she was feeling fine.

## 2015-10-24 NOTE — ED Provider Notes (Signed)
[facility] Emergency Department Provider Note  ____________________________________________  Time seen: Approximately 12:05 PM  I have reviewed the triage vital signs and the nursing notes.   HISTORY  Chief Complaint Near Syncope and Nausea    HPI Kendra Flores is a 81 y.o. female with history of GERD and arthritis who presents for evaluation of syncope today, and gradual onset just prior to arrival, now resolved. The patient reports that she awoke this morning feeling nauseated. She was sitting in her church service when her nausea worsened, she gradually started to feel flushed and lightheaded, told her husband "I think I'm going to faint" and then proceeded to have a syncopal episode. She did not fall or hit her head. She had no chest pain or difficulty breathing. She's been nauseated without vomiting. No diarrhea. No abdominal pain. She has fainted in the past and was told previously it may have been due to a low blood pressure. Currently her nausea is severe but she has no other complaints. There are no modifying factors.    Past Medical History  Diagnosis Date  . Arthritis   . GERD (gastroesophageal reflux disease)     Patient Active Problem List   Diagnosis Date Noted  . Lumbar stenosis 04/01/2012  . Paronychia of finger 06/05/2011    No past surgical history on file.  Current Outpatient Rx  Name  Route  Sig  Dispense  Refill  . aspirin-acetaminophen-caffeine (EXCEDRIN MIGRAINE) 250-250-65 MG per tablet   Oral   Take 2 tablets by mouth. Takes up to 5 a day         . brimonidine (ALPHAGAN P) 0.1 % SOLN               . fluticasone (FLONASE) 50 MCG/ACT nasal spray   Nasal   Place 2 sprays into the nose daily.         Marland Kitchen ketoconazole (NIZORAL) 2 % cream   Topical   Apply 1 application topically daily.           . metaxalone (SKELAXIN) 800 MG tablet   Oral   Take 800 mg by mouth. prn         . mupirocin (BACTROBAN) 2 %  ointment   Topical   Apply 1 application topically 2 (two) times daily.           Marland Kitchen omeprazole (PRILOSEC) 20 MG capsule   Oral   Take 20 mg by mouth daily.           . TRAMADOL HCL PO   Oral   Take by mouth.             Allergies Ciprofloxacin and Strawberry extract  No family history on file.  Social History Social History  Substance Use Topics  . Smoking status: Never Smoker   . Smokeless tobacco: Never Used  . Alcohol Use: No    Review of Systems Constitutional: No fever/chills Eyes: No visual changes. ENT: No sore throat. Cardiovascular: Denies chest pain. Respiratory: Denies shortness of breath. Gastrointestinal: No abdominal pain.  + nausea, no vomiting.  No diarrhea.  No constipation. Genitourinary: Negative for dysuria. Musculoskeletal: Negative for back pain. Skin: Negative for rash. Neurological: Negative for headaches, focal weakness or numbness.  10-point ROS otherwise negative.  ____________________________________________   PHYSICAL EXAM:  VITAL SIGNS: ED Triage Vitals  Enc Vitals Group     BP 10/24/15 1111 126/64 mmHg     Pulse Rate 10/24/15 1049 58  Resp 10/24/15 1049 12     Temp 10/24/15 1049 97.5 F (36.4 C)     Temp Source 10/24/15 1049 Oral     SpO2 10/24/15 1049 98 %     Weight 10/24/15 1049 180 lb (81.647 kg)     Height 10/24/15 1049 5\' 6"  (1.676 m)     Head Cir --      Peak Flow --      Pain Score 10/24/15 1051 0     Pain Loc --      Pain Edu? --      Excl. in GC? --     Constitutional: Alert and oriented. Well appearing and in no acute distress. Eyes: Conjunctivae are normal. PERRL. EOMI. Head: Atraumatic. Nose: No congestion/rhinnorhea. Mouth/Throat: Mucous membranes are moist.  Oropharynx non-erythematous. Neck: No stridor.   Cardiovascular: Normal rate, regular rhythm. Grossly normal heart sounds.  Good peripheral circulation. Respiratory: Normal respiratory effort.  No retractions. Lungs  CTAB. Gastrointestinal: Soft and nontender. No distention. No CVA tenderness. Genitourinary: deferred Musculoskeletal: No lower extremity tenderness nor edema.  No joint effusions. Neurologic:  Normal speech and language. No gross focal neurologic deficits are appreciated. No gait instability. 5 out of 5 strength in bilateral upper and lower extremities, sensation intact to light touch throughout. Skin:  Skin is warm, dry and intact. No rash noted. Psychiatric: Mood and affect are normal. Speech and behavior are normal.  ____________________________________________   LABS (all labs ordered are listed, but only abnormal results are displayed)  Labs Reviewed  BASIC METABOLIC PANEL - Abnormal; Notable for the following:    Glucose, Bld 129 (*)    BUN 26 (*)    All other components within normal limits  CBC - Abnormal; Notable for the following:    RDW 14.9 (*)    All other components within normal limits  URINALYSIS COMPLETEWITH MICROSCOPIC ([facility] ONLY) - Abnormal; Notable for the following:    Color, Urine YELLOW (*)    APPearance HAZY (*)    Leukocytes, UA 3+ (*)    Bacteria, UA FEW (*)    Squamous Epithelial / LPF 0-5 (*)    All other components within normal limits  HEPATIC FUNCTION PANEL - Abnormal; Notable for the following:    Total Protein 6.2 (*)    All other components within normal limits  TROPONIN I  CBG MONITORING, ED   ____________________________________________  EKG  ED ECG REPORT I, Gayla DossGayle, Nashika Coker A, the attending physician, personally viewed and interpreted this ECG.   Date: 10/24/2015  EKG Time: 10:50   Rate: 62  Rhythm: normal EKG, normal sinus rhythm  Axis: normal  Intervals:none  ST&T Change: No acute ST elevation.  ____________________________________________  RADIOLOGY  CXR IMPRESSION: IMPRESSION: Lungs are clear and there is no evidence of acute cardiopulmonary abnormality.   CT head IMPRESSION: No acute abnormality  noted. ____________________________________________   PROCEDURES  Procedure(s) performed: None  Critical Care performed: No  ____________________________________________   INITIAL IMPRESSION / ASSESSMENT AND PLAN / ED COURSE  Pertinent labs & imaging results that were available during my care of the patient were reviewed by me and considered in my medical decision making (see chart for details).  Kendra Flores is a 81 y.o. female with history of GERD and arthritis who presents for evaluation of syncope today, and gradual onset just prior to arrival, now resolved. On exam, she is nontoxic appearing and in no acute distress. Vital signs stable, she is afebrile per she is complaining of nausea  but has not vomited. She has a benign examination as well as an intact neurological examination. Suspect vasovagal syncope given significant prodrome. No chest pain, no palpitations, EKG reassuring, doubt cardiogenic syncope. Intact neurological exam and I doubt purely neurogenic cause of syncope however we'll obtain CT head, screening labs, urinalysis, chest x-ray. Reassess for disposition.  ----------------------------------------- 3:16 PM on 10/24/2015 -----------------------------------------  The patient reports she feels well at this time and is requesting discharge but she is tolerating by mouth intake. Urinalysis is concerning for urinary tract infection, we'll discharge with Keflex. Labs reviewed. CMP and CBC are unremarkable, troponin negative. CT head negative for any acute intercranial process. Chest x-ray negative for any acute cardiopulmonary process. Urine culture sent. We discussed return precautions, need for close PCP follow-up and she is comfortable with the discharge plan. DC home. ____________________________________________   FINAL CLINICAL IMPRESSION(S) / ED DIAGNOSES  Final diagnoses:  UTI (lower urinary tract infection)  Syncope, unspecified syncope type      Gayla Doss, MD 10/24/15 1517

## 2015-10-24 NOTE — ED Notes (Signed)
Patient transported to CT 

## 2015-11-25 DIAGNOSIS — R5383 Other fatigue: Secondary | ICD-10-CM | POA: Diagnosis not present

## 2015-11-25 DIAGNOSIS — R399 Unspecified symptoms and signs involving the genitourinary system: Secondary | ICD-10-CM | POA: Diagnosis not present

## 2015-11-25 DIAGNOSIS — Z13220 Encounter for screening for lipoid disorders: Secondary | ICD-10-CM | POA: Diagnosis not present

## 2015-11-25 DIAGNOSIS — K4090 Unilateral inguinal hernia, without obstruction or gangrene, not specified as recurrent: Secondary | ICD-10-CM | POA: Diagnosis not present

## 2015-11-25 DIAGNOSIS — Z87898 Personal history of other specified conditions: Secondary | ICD-10-CM | POA: Diagnosis not present

## 2015-11-25 DIAGNOSIS — I10 Essential (primary) hypertension: Secondary | ICD-10-CM | POA: Diagnosis not present

## 2015-11-25 DIAGNOSIS — R5381 Other malaise: Secondary | ICD-10-CM | POA: Diagnosis not present

## 2015-11-25 DIAGNOSIS — R300 Dysuria: Secondary | ICD-10-CM | POA: Diagnosis not present

## 2015-11-26 DIAGNOSIS — Z13220 Encounter for screening for lipoid disorders: Secondary | ICD-10-CM | POA: Diagnosis not present

## 2015-11-26 DIAGNOSIS — R399 Unspecified symptoms and signs involving the genitourinary system: Secondary | ICD-10-CM | POA: Diagnosis not present

## 2015-11-26 DIAGNOSIS — I10 Essential (primary) hypertension: Secondary | ICD-10-CM | POA: Diagnosis not present

## 2015-12-01 DIAGNOSIS — E782 Mixed hyperlipidemia: Secondary | ICD-10-CM | POA: Diagnosis not present

## 2015-12-01 DIAGNOSIS — R55 Syncope and collapse: Secondary | ICD-10-CM | POA: Diagnosis not present

## 2015-12-30 DIAGNOSIS — E782 Mixed hyperlipidemia: Secondary | ICD-10-CM | POA: Diagnosis not present

## 2015-12-30 DIAGNOSIS — I10 Essential (primary) hypertension: Secondary | ICD-10-CM | POA: Diagnosis not present

## 2016-01-25 DIAGNOSIS — E782 Mixed hyperlipidemia: Secondary | ICD-10-CM | POA: Diagnosis not present

## 2016-01-25 DIAGNOSIS — I10 Essential (primary) hypertension: Secondary | ICD-10-CM | POA: Diagnosis not present

## 2016-02-02 DIAGNOSIS — H401131 Primary open-angle glaucoma, bilateral, mild stage: Secondary | ICD-10-CM | POA: Diagnosis not present

## 2016-02-29 DIAGNOSIS — I10 Essential (primary) hypertension: Secondary | ICD-10-CM | POA: Diagnosis not present

## 2016-02-29 DIAGNOSIS — E782 Mixed hyperlipidemia: Secondary | ICD-10-CM | POA: Diagnosis not present

## 2016-03-17 DIAGNOSIS — M4807 Spinal stenosis, lumbosacral region: Secondary | ICD-10-CM | POA: Diagnosis not present

## 2016-03-17 DIAGNOSIS — F518 Other sleep disorders not due to a substance or known physiological condition: Secondary | ICD-10-CM | POA: Diagnosis not present

## 2016-03-17 DIAGNOSIS — I10 Essential (primary) hypertension: Secondary | ICD-10-CM | POA: Diagnosis not present

## 2016-03-17 DIAGNOSIS — G4720 Circadian rhythm sleep disorder, unspecified type: Secondary | ICD-10-CM | POA: Diagnosis not present

## 2016-05-10 DIAGNOSIS — R339 Retention of urine, unspecified: Secondary | ICD-10-CM | POA: Diagnosis not present

## 2016-05-10 DIAGNOSIS — N814 Uterovaginal prolapse, unspecified: Secondary | ICD-10-CM | POA: Diagnosis not present

## 2016-05-10 DIAGNOSIS — R350 Frequency of micturition: Secondary | ICD-10-CM | POA: Diagnosis not present

## 2016-05-10 DIAGNOSIS — H6123 Impacted cerumen, bilateral: Secondary | ICD-10-CM | POA: Diagnosis not present

## 2016-05-15 DIAGNOSIS — R350 Frequency of micturition: Secondary | ICD-10-CM | POA: Diagnosis not present

## 2016-05-15 DIAGNOSIS — N812 Incomplete uterovaginal prolapse: Secondary | ICD-10-CM | POA: Diagnosis not present

## 2016-05-15 DIAGNOSIS — N362 Urethral caruncle: Secondary | ICD-10-CM | POA: Diagnosis not present

## 2016-05-15 DIAGNOSIS — N3000 Acute cystitis without hematuria: Secondary | ICD-10-CM | POA: Diagnosis not present

## 2016-06-04 DIAGNOSIS — R358 Other polyuria: Secondary | ICD-10-CM | POA: Diagnosis not present

## 2016-06-04 DIAGNOSIS — R350 Frequency of micturition: Secondary | ICD-10-CM | POA: Diagnosis not present

## 2016-06-16 DIAGNOSIS — N390 Urinary tract infection, site not specified: Secondary | ICD-10-CM | POA: Diagnosis not present

## 2016-06-20 DIAGNOSIS — J302 Other seasonal allergic rhinitis: Secondary | ICD-10-CM | POA: Diagnosis not present

## 2016-06-20 DIAGNOSIS — H6983 Other specified disorders of Eustachian tube, bilateral: Secondary | ICD-10-CM | POA: Diagnosis not present

## 2016-06-20 DIAGNOSIS — R339 Retention of urine, unspecified: Secondary | ICD-10-CM | POA: Diagnosis not present

## 2016-06-20 DIAGNOSIS — N390 Urinary tract infection, site not specified: Secondary | ICD-10-CM | POA: Diagnosis not present

## 2016-06-26 DIAGNOSIS — N819 Female genital prolapse, unspecified: Secondary | ICD-10-CM | POA: Diagnosis not present

## 2016-06-26 DIAGNOSIS — N390 Urinary tract infection, site not specified: Secondary | ICD-10-CM | POA: Diagnosis not present

## 2016-06-26 DIAGNOSIS — R8271 Bacteriuria: Secondary | ICD-10-CM | POA: Diagnosis not present

## 2016-07-21 DIAGNOSIS — N814 Uterovaginal prolapse, unspecified: Secondary | ICD-10-CM | POA: Diagnosis not present

## 2016-07-21 DIAGNOSIS — Z0000 Encounter for general adult medical examination without abnormal findings: Secondary | ICD-10-CM | POA: Diagnosis not present

## 2016-07-21 DIAGNOSIS — Z789 Other specified health status: Secondary | ICD-10-CM | POA: Diagnosis not present

## 2016-07-21 DIAGNOSIS — M4696 Unspecified inflammatory spondylopathy, lumbar region: Secondary | ICD-10-CM | POA: Diagnosis not present

## 2016-07-21 DIAGNOSIS — M160 Bilateral primary osteoarthritis of hip: Secondary | ICD-10-CM | POA: Diagnosis not present

## 2016-07-21 DIAGNOSIS — M4807 Spinal stenosis, lumbosacral region: Secondary | ICD-10-CM | POA: Diagnosis not present

## 2016-07-27 DIAGNOSIS — Z1231 Encounter for screening mammogram for malignant neoplasm of breast: Secondary | ICD-10-CM | POA: Diagnosis not present

## 2016-08-01 DIAGNOSIS — H401131 Primary open-angle glaucoma, bilateral, mild stage: Secondary | ICD-10-CM | POA: Diagnosis not present

## 2016-08-08 DIAGNOSIS — H401131 Primary open-angle glaucoma, bilateral, mild stage: Secondary | ICD-10-CM | POA: Diagnosis not present

## 2016-09-25 DIAGNOSIS — R8271 Bacteriuria: Secondary | ICD-10-CM | POA: Diagnosis not present

## 2016-09-25 DIAGNOSIS — N952 Postmenopausal atrophic vaginitis: Secondary | ICD-10-CM | POA: Diagnosis not present

## 2016-09-25 DIAGNOSIS — N362 Urethral caruncle: Secondary | ICD-10-CM | POA: Diagnosis not present

## 2016-09-25 DIAGNOSIS — N814 Uterovaginal prolapse, unspecified: Secondary | ICD-10-CM | POA: Diagnosis not present

## 2016-11-17 DIAGNOSIS — Z124 Encounter for screening for malignant neoplasm of cervix: Secondary | ICD-10-CM | POA: Diagnosis not present

## 2016-12-28 ENCOUNTER — Ambulatory Visit
Admission: EM | Admit: 2016-12-28 | Discharge: 2016-12-28 | Disposition: A | Discharge status: Home / Self Care | Payer: PPO | Attending: Family Medicine | Admitting: Family Medicine

## 2016-12-28 DIAGNOSIS — N3000 Acute cystitis without hematuria: Secondary | ICD-10-CM

## 2016-12-28 HISTORY — DX: Reserved for inherently not codable concepts without codable children: IMO0001

## 2016-12-28 HISTORY — DX: Overactive bladder: N32.81

## 2016-12-28 HISTORY — DX: Procedure and treatment not carried out because of patient's decision for reasons of belief and group pressure: Z53.1

## 2016-12-28 HISTORY — DX: Hyperlipidemia, unspecified: E78.5

## 2016-12-28 HISTORY — DX: Postmenopausal atrophic vaginitis: N95.2

## 2016-12-28 HISTORY — DX: Essential (primary) hypertension: I10

## 2016-12-28 LAB — URINALYSIS, COMPLETE (UACMP) WITH MICROSCOPIC
BILIRUBIN URINE: NEGATIVE
Glucose, UA: NEGATIVE mg/dL
Ketones, ur: NEGATIVE mg/dL
NITRITE: NEGATIVE
Protein, ur: NEGATIVE mg/dL
SPECIFIC GRAVITY, URINE: 1.015 (ref 1.005–1.030)
pH: 7 (ref 5.0–8.0)

## 2016-12-28 MED ORDER — CEPHALEXIN 500 MG PO CAPS: 500.0000 mg | ORAL_CAPSULE | Freq: Two times a day (BID) | ORAL | 0 refills | Status: AC

## 2016-12-28 NOTE — ED Provider Notes (Signed)
[facility]    CSN: [id number] Arrival date & time: 12/28/16  1512     History   Chief Complaint Chief Complaint  Patient presents with  . Urinary Urgency    HPI Kendra Flores is a 83 y.o. female.   Patient is an 83 year old white female who is here with her husband. Several things were concerned about maintaining she is concerned she may have UTI. Patient really doesn't respond to reck requesting an constantly refers to our ask if we checked her urine and that she's had frequency. She denies any burning and urination.  She's had a lot of trouble with her urine and I'm unable to at this time get a good history on the various problems she's had bladder. Her husband may focus that she's always tired but this is nothing new. When asked us for specific times along the urinary track infection or frequency has been occurring they say a while when asked how long she been fatigue even longer and also while. She's had UTIs before in the past. She is informing me several times [facility] but they came over here. [facility] and was unable to be seen there and he came here.   The history is provided by the patient and the spouse. The history is limited by the condition of the patient. No language interpreter was used.  Urinary Frequency  This is a recurrent problem. The problem has not changed since onset.Pertinent negatives include no chest pain, no abdominal pain, no headaches and no shortness of breath. She has tried nothing for the symptoms.    Past Medical History:  Diagnosis Date  . Arthritis   . Atrophic vaginitis   . GERD (gastroesophageal reflux disease)   . Hyperlipidemia   . Hypertension   . OAB (overactive bladder)   . Refusal of blood transfusions as patient is Jehovah's Witness     Patient Active Problem List   Diagnosis Date Noted  . Lumbar stenosis 04/01/2012  . Paronychia of finger 06/05/2011     Past Surgical History:  Procedure Laterality Date  . COLONOSCOPY    . INGUINAL HERNIA REPAIR Left 01/29/2015  . LUMBAR SPINE SURGERY    . SHOULDER SURGERY Right 1952  . TOTAL HIP ARTHROPLASTY Right 07/28/2013  . TOTAL HIP ARTHROPLASTY Left 02/12/2014  . VEIN LIGATION AND STRIPPING      OB History    No data available       Home Medications    Prior to Admission medications   Medication Sig Start Date End Date Taking? Authorizing Provider  aspirin-acetaminophen-caffeine (EXCEDRIN MIGRAINE) [phone] MG per tablet Take 2 tablets by mouth. Takes up to 5 a day   Yes Historical Provider, MD  brimonidine (ALPHAGAN P) 0.1 % SOLN    Yes Historical Provider, MD  fluticasone (FLONASE) 50 MCG/ACT nasal spray Place 2 sprays into the nose daily.   Yes Historical Provider, MD  ketoconazole (NIZORAL) 2 % cream Apply 1 application topically daily.     Yes Historical Provider, MD  mupirocin (BACTROBAN) 2 % ointment Apply 1 application topically 2 (two) times daily.     Yes Historical Provider, MD  omeprazole (PRILOSEC) 20 MG capsule Take 20 mg by mouth daily.     Yes Historical Provider, MD  TRAMADOL HCL PO Take by mouth.     Yes Historical Provider, MD  cephALEXin (KEFLEX) 500 MG capsule Take 1 capsule (500 mg total)  by mouth 4 (four) times daily. 10/24/15   Gayla Doss, MD  cephALEXin (KEFLEX) 500 MG capsule Take 1 capsule (500 mg total) by mouth 2 (two) times daily. 12/28/16   Hassan Rowan, MD  metaxalone (SKELAXIN) 800 MG tablet Take 800 mg by mouth. prn    Historical Provider, MD    Family History No family history on file.  Social History Social History  Substance Use Topics  . Smoking status: Never Smoker  . Smokeless tobacco: Never Used  . Alcohol use No     Allergies   Azithromycin; Celebrex [celecoxib]; Ciprofloxacin; Codeine; Duloxetine; Hydrocodone; Meloxicam; Monosodium glutamate; Pentazocine; and Strawberry extract   Review of Systems Review of Systems  Unable  to perform ROS: Other  Constitutional: Positive for fatigue.  Respiratory: Negative for shortness of breath.   Cardiovascular: Negative for chest pain.  Gastrointestinal: Negative for abdominal pain.  Genitourinary: Positive for frequency.  Neurological: Negative for headaches.     Physical Exam Triage Vital Signs ED Triage Vitals [12/28/16 1531]  Enc Vitals Group     BP (!) 163/90     Pulse Rate 96     Resp 18     Temp 97.8 F (36.6 C)     Temp Source Oral     SpO2 97 %     Weight 190 lb (86.2 kg)     Height 5\' 5"  (1.651 m)     Head Circumference      Peak Flow      Pain Score      Pain Loc      Pain Edu?      Excl. in GC?    No data found.   Updated Vital Signs BP (!) 163/90 (BP Location: Left Arm)   Pulse 96   Temp 97.8 F (36.6 C) (Oral)   Resp 18   Ht 5\' 5"  (1.651 m)   Wt 190 lb (86.2 kg)   SpO2 97%   BMI 31.62 kg/m   Visual Acuity Right Eye Distance:   Left Eye Distance:   Bilateral Distance:    Right Eye Near:   Left Eye Near:    Bilateral Near:     Physical Exam  Constitutional:  Non-toxic appearance. She does not have a sickly appearance. She does not appear ill. No distress.  Elderly white female appears be some what anxious  HENT:  Head: Normocephalic and atraumatic.  Right Ear: External ear normal.  Left Ear: External ear normal.  Eyes: Pupils are equal, round, and reactive to light.  Neck: Normal range of motion. Neck supple.  Pulmonary/Chest: Effort normal.  Abdominal: Soft. Normal appearance. She exhibits no distension. There is no tenderness. There is no CVA tenderness.  Musculoskeletal: Normal range of motion.  Skin: Skin is warm.  Vitals reviewed.    UC Treatments / Results  Labs (all labs ordered are listed, but only abnormal results are displayed) Labs Reviewed  URINALYSIS, COMPLETE (UACMP) WITH MICROSCOPIC - Abnormal; Notable for the following:       Result Value   Color, Urine STRAW (*)    Hgb urine dipstick TRACE (*)     Leukocytes, UA TRACE (*)    Squamous Epithelial / LPF 0-5 (*)    Bacteria, UA MANY (*)    All other components within normal limits  URINE CULTURE    EKG  EKG Interpretation None       Radiology No results found.  Procedures Procedures (including critical care time)  Medications Ordered in  UC Medications - No data to display  Results for orders placed or performed during the hospital encounter of 12/28/16  Urinalysis, Complete w Microscopic  Result Value Ref Range   Color, Urine STRAW (A) YELLOW   APPearance CLEAR CLEAR   Specific Gravity, Urine 1.015 1.005 - 1.030   pH 7.0 5.0 - 8.0   Glucose, UA NEGATIVE NEGATIVE mg/dL   Hgb urine dipstick TRACE (A) NEGATIVE   Bilirubin Urine NEGATIVE NEGATIVE   Ketones, ur NEGATIVE NEGATIVE mg/dL   Protein, ur NEGATIVE NEGATIVE mg/dL   Nitrite NEGATIVE NEGATIVE   Leukocytes, UA TRACE (A) NEGATIVE   Squamous Epithelial / LPF 0-5 (A) NONE SEEN   WBC, UA 0-5 0 - 5 WBC/hpf   RBC / HPF 0-5 0 - 5 RBC/hpf   Bacteria, UA MANY (A) NONE SEEN   Initial Impression / Assessment and Plan / UC Course  I have reviewed the triage vital signs and the nursing notes.  Pertinent labs & imaging results that were available during my care of the patient were reviewed by me and considered in my medical decision making (see chart for details).   patient will be treated with Keflex since pass an antibiotic she's had before according to records. We'll place on Keflex 500 mg 1 tablet twice a day. Far as fatigue is concerned that persists please follow-up with her PCP. Urine culture also be obtained as well.  Final diagnoses:  Acute cystitis without hematuria    New Prescriptions New Prescriptions   CEPHALEXIN (KEFLEX) 500 MG CAPSULE    Take 1 capsule (500 mg total) by mouth 2 (two) times daily.     Hassan Rowan, MD 12/28/16 [phone]

## 2016-12-28 NOTE — ED Triage Notes (Addendum)
Patient complains of urinary frequency. Patient states that she is unsure of when symptoms started. Patient denies burning.

## 2016-12-31 ENCOUNTER — Telehealth: Payer: Self-pay | Admitting: Internal Medicine

## 2016-12-31 ENCOUNTER — Telehealth: Payer: Self-pay

## 2016-12-31 LAB — URINE CULTURE: SPECIAL REQUESTS: NORMAL

## 2016-12-31 MED ORDER — SULFAMETHOXAZOLE-TRIMETHOPRIM 800-160 MG PO TABS: 1.0000 | ORAL_TABLET | Freq: Two times a day (BID) | ORAL | 0 refills | Status: DC

## 2016-12-31 MED ORDER — SULFAMETHOXAZOLE-TRIMETHOPRIM 800-160 MG PO TABS: 1.0000 | ORAL_TABLET | Freq: Two times a day (BID) | ORAL | 0 refills | Status: AC

## 2016-12-31 NOTE — Telephone Encounter (Signed)
-----   Message from Eustace MooreLaura W Murray, MD sent at 12/31/2016  9:18 AM EST ----- Clinical staff, please let patient know that urine culture was positive for Enterobacter germ, [facility] but sensitive to trimethoprim/sulfa.  Rx trimethoprim/sulfa sent to pharmacy of record, [facility] [place].  Stop cephalexin and take trimethoprim sulfa instead.  Recheck or followup with PCP for further evaluation if symptoms are not improving.  Result note copied to patient's MyChart.  LM

## 2016-12-31 NOTE — Telephone Encounter (Signed)
Left message for patient to call to discuss recent visit and a change in mediation for her.

## 2016-12-31 NOTE — Telephone Encounter (Signed)
Clinical staff, please let patient know that urine culture was positive for Enterobacter germ, resistant [facility] but sensitive to trimethoprim/sulfa.  Rx trimethoprim/sulfa sent to pharmacy of record, [facility] [place].  Stop Cephalexin and take trimethoprim sulfa instead.  Recheck or followup with PCP for further evaluation if symptoms are not improving.  LM

## 2017-01-02 DIAGNOSIS — E782 Mixed hyperlipidemia: Secondary | ICD-10-CM | POA: Diagnosis not present

## 2017-01-02 DIAGNOSIS — M542 Cervicalgia: Secondary | ICD-10-CM | POA: Diagnosis not present

## 2017-01-02 DIAGNOSIS — M160 Bilateral primary osteoarthritis of hip: Secondary | ICD-10-CM | POA: Diagnosis not present

## 2017-01-02 DIAGNOSIS — G4720 Circadian rhythm sleep disorder, unspecified type: Secondary | ICD-10-CM | POA: Diagnosis not present

## 2017-01-02 DIAGNOSIS — R5383 Other fatigue: Secondary | ICD-10-CM | POA: Diagnosis not present

## 2017-01-02 DIAGNOSIS — M4696 Unspecified inflammatory spondylopathy, lumbar region: Secondary | ICD-10-CM | POA: Diagnosis not present

## 2017-01-02 DIAGNOSIS — R5381 Other malaise: Secondary | ICD-10-CM | POA: Diagnosis not present

## 2017-01-02 DIAGNOSIS — R339 Retention of urine, unspecified: Secondary | ICD-10-CM | POA: Diagnosis not present

## 2017-01-02 DIAGNOSIS — I10 Essential (primary) hypertension: Secondary | ICD-10-CM | POA: Diagnosis not present

## 2017-01-02 DIAGNOSIS — N814 Uterovaginal prolapse, unspecified: Secondary | ICD-10-CM | POA: Diagnosis not present

## 2017-01-02 DIAGNOSIS — F518 Other sleep disorders not due to a substance or known physiological condition: Secondary | ICD-10-CM | POA: Diagnosis not present

## 2017-01-11 IMAGING — CR DG CHEST 1V PORT
1 series · 1 of 1 positions shown · non-contrast
Comparison: None.

CLINICAL DATA: Near syncopal episode, nausea.

EXAM:
PORTABLE CHEST 1 VIEW

[ap]
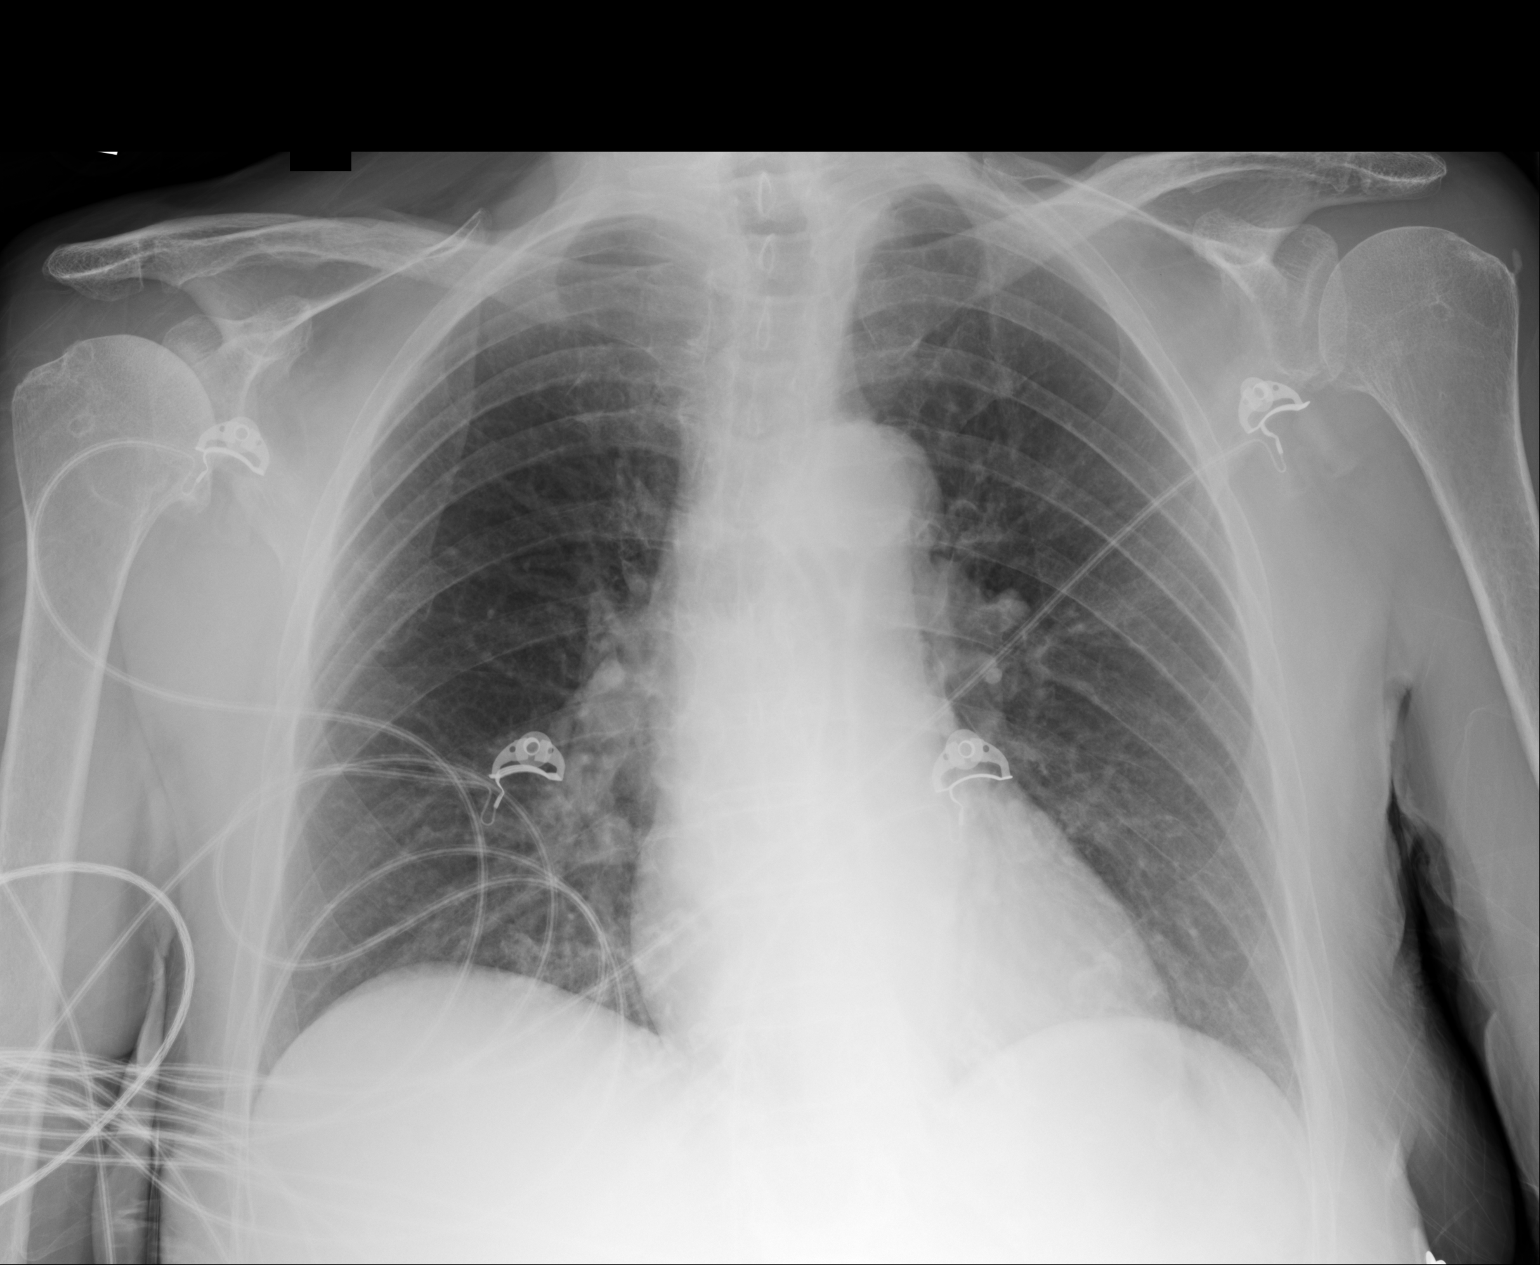

[1 of 1 positions shown; findings below may reference images not displayed]

FINDINGS: The heart size and mediastinal contours are within normal limits.
Both lungs are clear. The visualized skeletal structures are
unremarkable.
IMPRESSION: Lungs are clear and there is no evidence of acute cardiopulmonary
abnormality.

## 2017-01-11 IMAGING — CT CT HEAD W/O CM
1 series · 16 of 30 positions shown, 20 images · non-contrast
Comparison: 12/07/2010

CLINICAL DATA: Near syncopal episode

EXAM:
CT HEAD WITHOUT CONTRAST
TECHNIQUE: Contiguous axial images were obtained from the base of the skull
through the vertex without intravenous contrast.

[Series 2: head wo · axial · 0.42mm/px · z∈[-179,-53]mm · 16 of 32 slices shown, 20 images]
[im 2/32  brain]
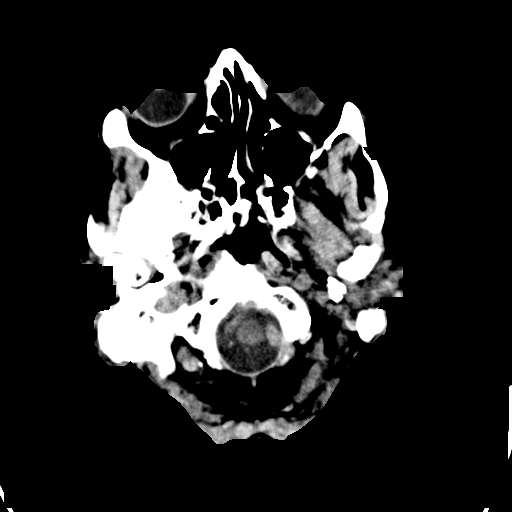
[im 2/32  bone]
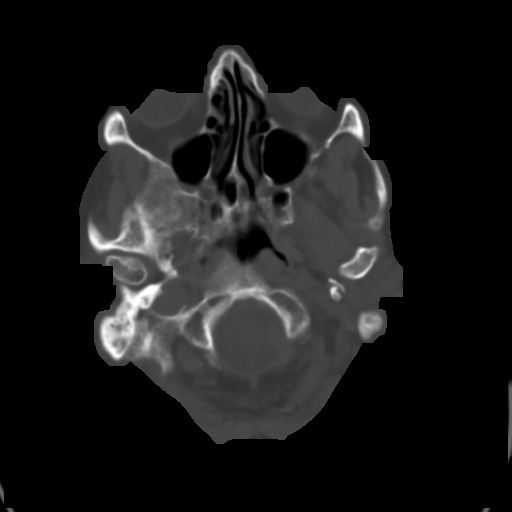
[im 4/32  brain]
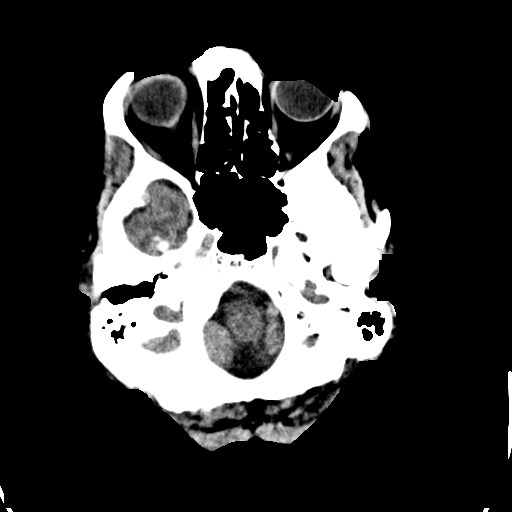
[im 6/32  brain]
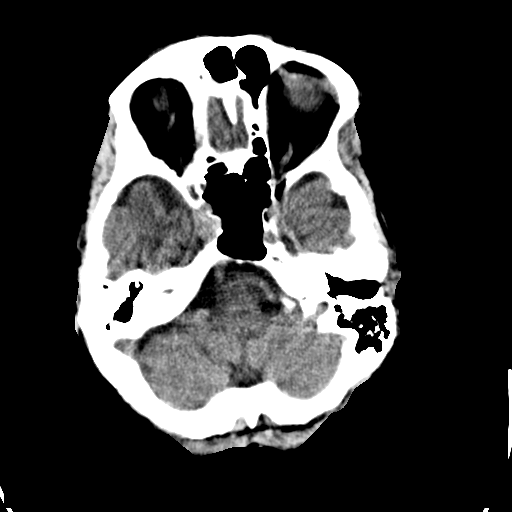
[im 8/32  brain]
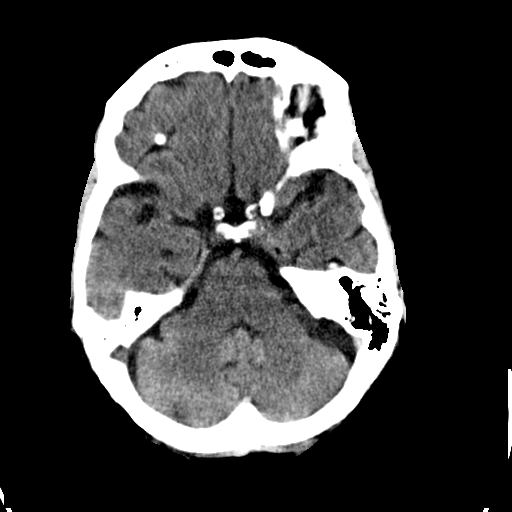
[im 9/32  brain]
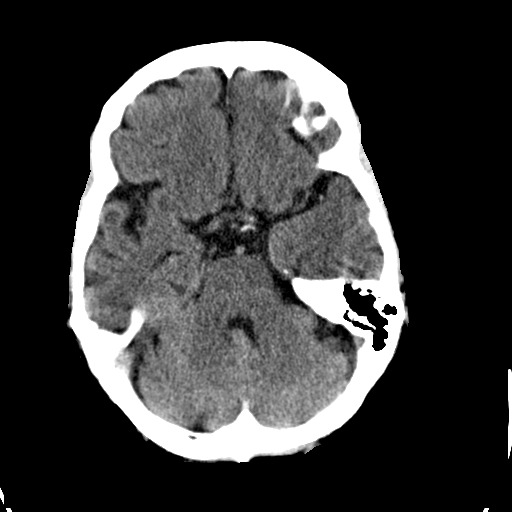
[im 9/32  bone]
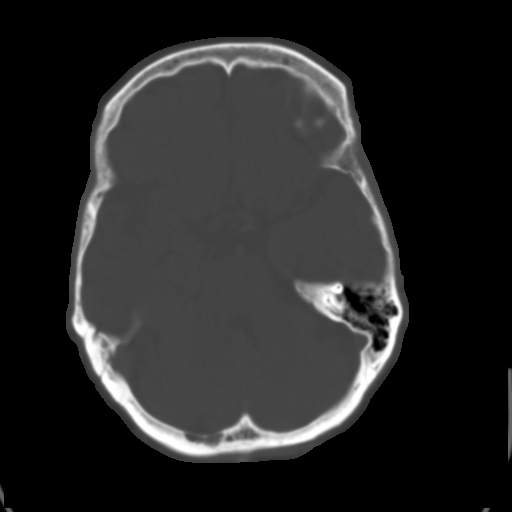
[im 11/32  brain]
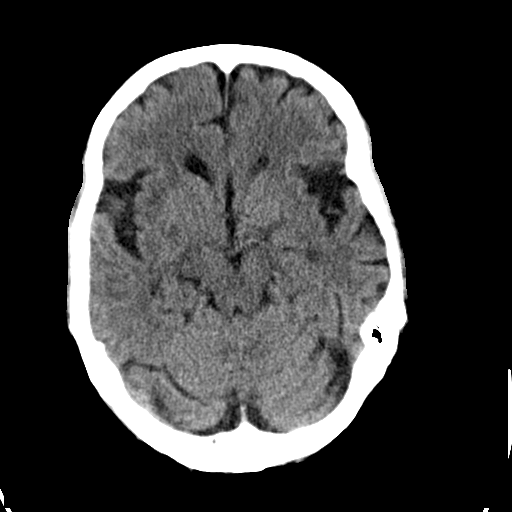
[im 13/32  brain]
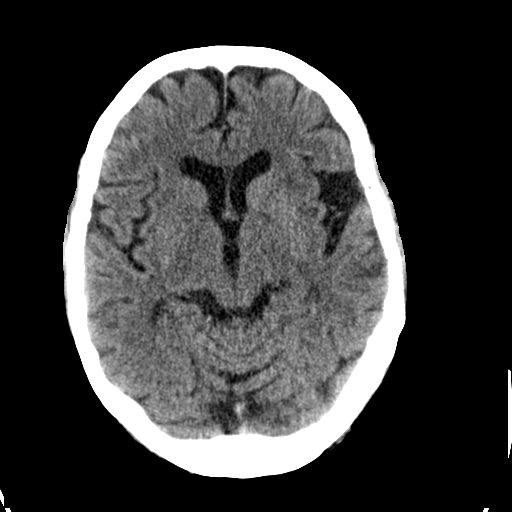
[im 15/32  brain]
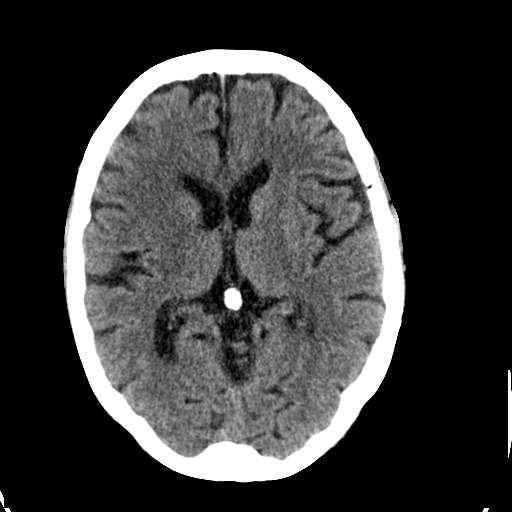
[im 17/32  brain]
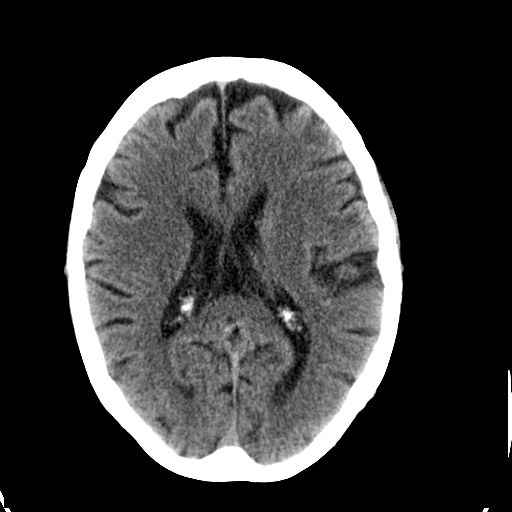
[im 17/32  bone]
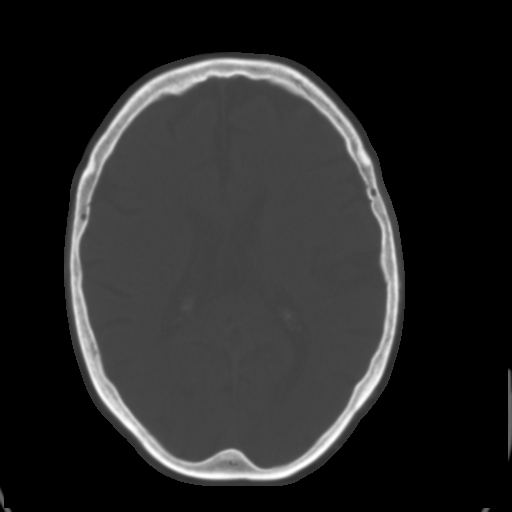
[im 19/32  brain]
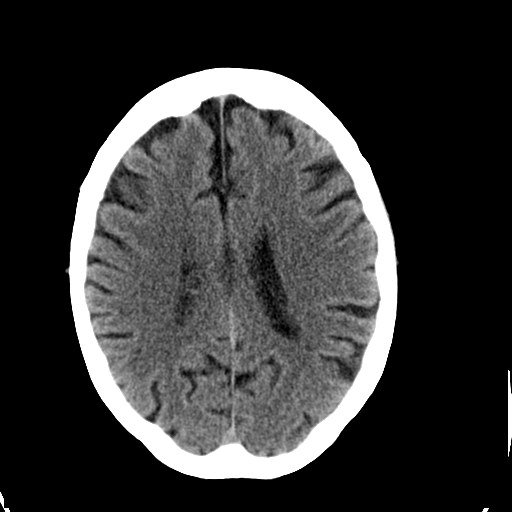
[im 21/32  brain]
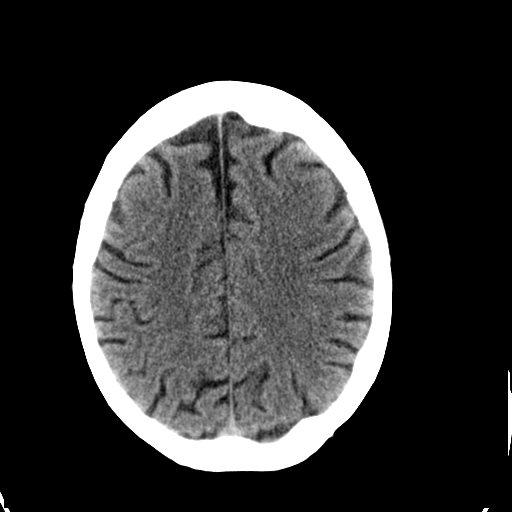
[im 23/32  brain]
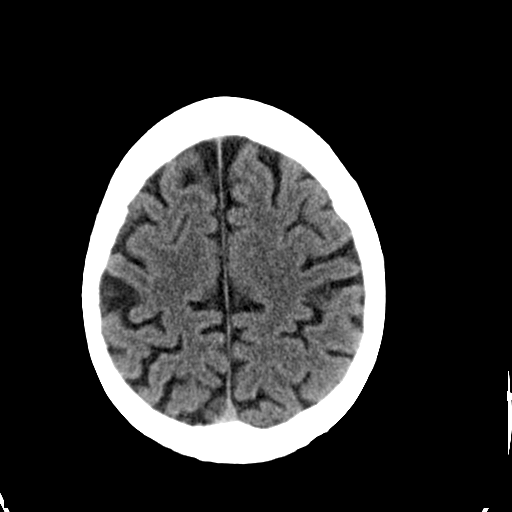
[im 24/32  brain]
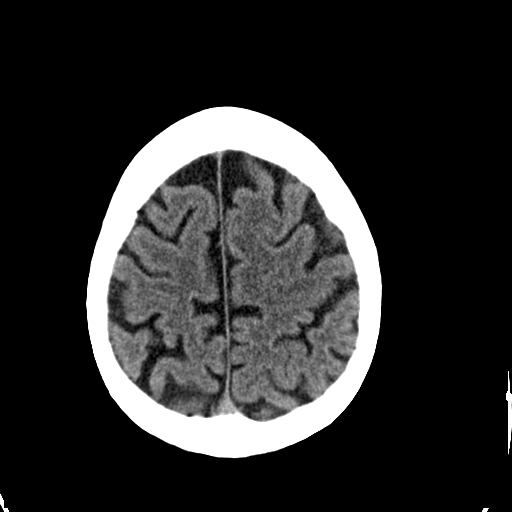
[im 24/32  bone]
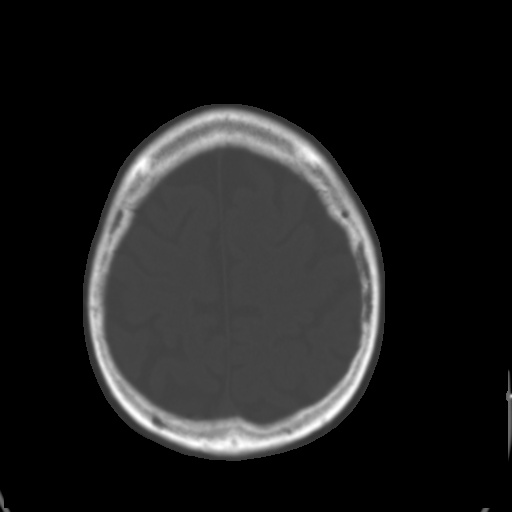
[im 26/32  brain]
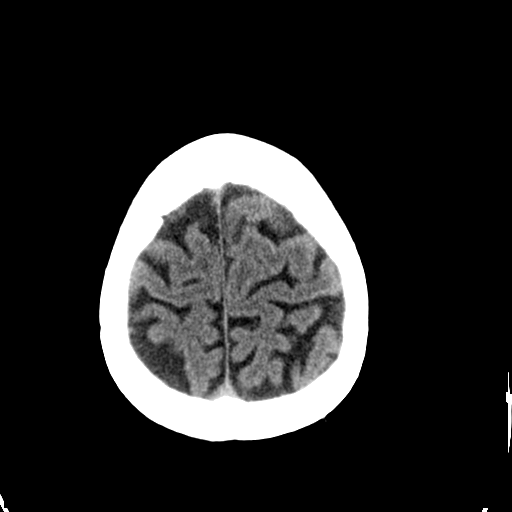
[im 28/32  brain]
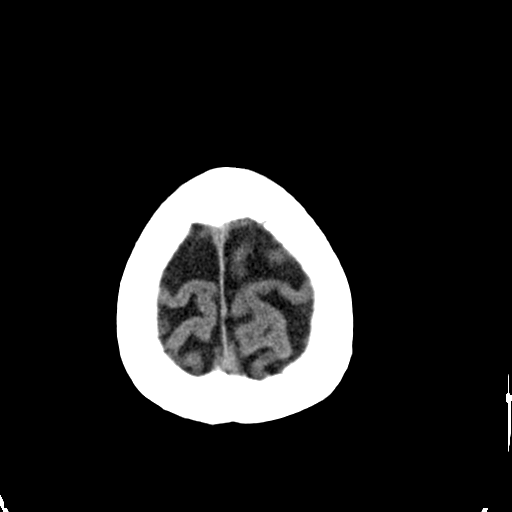
[im 30/32  brain]
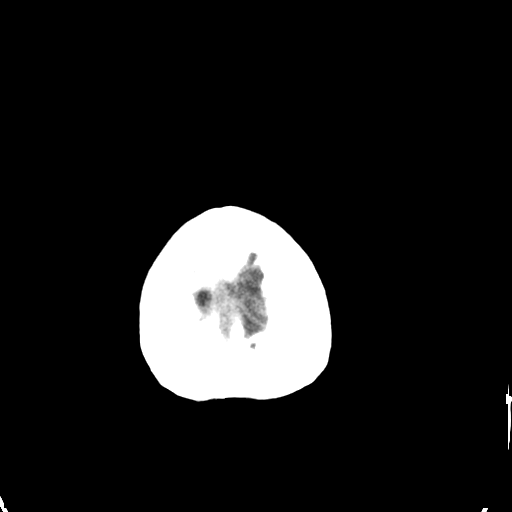

[16 of 30 positions shown; findings below may reference images not displayed]

FINDINGS: Bony calvarium is intact. No gross soft tissue abnormality is noted.

Atrophic changes without acute abnormality. No focal hemorrhage or
infarct is noted.
IMPRESSION: No acute abnormality noted.

## 2017-02-13 DIAGNOSIS — H401131 Primary open-angle glaucoma, bilateral, mild stage: Secondary | ICD-10-CM | POA: Diagnosis not present

## 2017-03-20 DIAGNOSIS — H401131 Primary open-angle glaucoma, bilateral, mild stage: Secondary | ICD-10-CM | POA: Diagnosis not present

## 2017-03-29 DIAGNOSIS — G4720 Circadian rhythm sleep disorder, unspecified type: Secondary | ICD-10-CM | POA: Diagnosis not present

## 2017-03-29 DIAGNOSIS — I10 Essential (primary) hypertension: Secondary | ICD-10-CM | POA: Diagnosis not present

## 2017-03-29 DIAGNOSIS — F518 Other sleep disorders not due to a substance or known physiological condition: Secondary | ICD-10-CM | POA: Diagnosis not present

## 2017-03-29 DIAGNOSIS — M542 Cervicalgia: Secondary | ICD-10-CM | POA: Diagnosis not present

## 2017-03-29 DIAGNOSIS — M4807 Spinal stenosis, lumbosacral region: Secondary | ICD-10-CM | POA: Diagnosis not present

## 2017-03-29 DIAGNOSIS — M1990 Unspecified osteoarthritis, unspecified site: Secondary | ICD-10-CM | POA: Diagnosis not present

## 2017-03-29 DIAGNOSIS — E782 Mixed hyperlipidemia: Secondary | ICD-10-CM | POA: Diagnosis not present

## 2017-08-14 DIAGNOSIS — H401131 Primary open-angle glaucoma, bilateral, mild stage: Secondary | ICD-10-CM | POA: Diagnosis not present

## 2017-08-22 DIAGNOSIS — H401131 Primary open-angle glaucoma, bilateral, mild stage: Secondary | ICD-10-CM | POA: Diagnosis not present

## 2017-09-06 DIAGNOSIS — H401131 Primary open-angle glaucoma, bilateral, mild stage: Secondary | ICD-10-CM | POA: Diagnosis not present

## 2017-09-25 DIAGNOSIS — Z0000 Encounter for general adult medical examination without abnormal findings: Secondary | ICD-10-CM | POA: Diagnosis not present

## 2017-09-25 DIAGNOSIS — Z789 Other specified health status: Secondary | ICD-10-CM | POA: Diagnosis not present

## 2017-09-25 DIAGNOSIS — Z23 Encounter for immunization: Secondary | ICD-10-CM | POA: Diagnosis not present

## 2017-12-07 DIAGNOSIS — Z124 Encounter for screening for malignant neoplasm of cervix: Secondary | ICD-10-CM | POA: Diagnosis not present

## 2017-12-07 DIAGNOSIS — Z01419 Encounter for gynecological examination (general) (routine) without abnormal findings: Secondary | ICD-10-CM | POA: Diagnosis not present

## 2017-12-07 DIAGNOSIS — Z1231 Encounter for screening mammogram for malignant neoplasm of breast: Secondary | ICD-10-CM | POA: Diagnosis not present

## 2017-12-07 DIAGNOSIS — N362 Urethral caruncle: Secondary | ICD-10-CM | POA: Diagnosis not present

## 2018-12-11 DIAGNOSIS — Z1231 Encounter for screening mammogram for malignant neoplasm of breast: Secondary | ICD-10-CM | POA: Diagnosis not present

## 2018-12-11 DIAGNOSIS — Z124 Encounter for screening for malignant neoplasm of cervix: Secondary | ICD-10-CM | POA: Diagnosis not present

## 2018-12-11 DIAGNOSIS — N819 Female genital prolapse, unspecified: Secondary | ICD-10-CM | POA: Diagnosis not present

## 2018-12-11 DIAGNOSIS — Z960 Presence of urogenital implants: Secondary | ICD-10-CM | POA: Diagnosis not present

## 2018-12-11 DIAGNOSIS — Z01411 Encounter for gynecological examination (general) (routine) with abnormal findings: Secondary | ICD-10-CM | POA: Diagnosis not present

## 2019-01-15 DIAGNOSIS — R413 Other amnesia: Secondary | ICD-10-CM | POA: Diagnosis not present

## 2019-01-15 DIAGNOSIS — N390 Urinary tract infection, site not specified: Secondary | ICD-10-CM | POA: Diagnosis not present

## 2019-01-15 DIAGNOSIS — R42 Dizziness and giddiness: Secondary | ICD-10-CM | POA: Diagnosis not present

## 2019-01-21 DIAGNOSIS — G4720 Circadian rhythm sleep disorder, unspecified type: Secondary | ICD-10-CM | POA: Diagnosis not present

## 2019-01-21 DIAGNOSIS — F518 Other sleep disorders not due to a substance or known physiological condition: Secondary | ICD-10-CM | POA: Diagnosis not present

## 2019-01-21 DIAGNOSIS — N814 Uterovaginal prolapse, unspecified: Secondary | ICD-10-CM | POA: Diagnosis not present

## 2019-01-21 DIAGNOSIS — Z780 Asymptomatic menopausal state: Secondary | ICD-10-CM | POA: Diagnosis not present

## 2019-01-21 DIAGNOSIS — Z23 Encounter for immunization: Secondary | ICD-10-CM | POA: Diagnosis not present

## 2019-01-21 DIAGNOSIS — I10 Essential (primary) hypertension: Secondary | ICD-10-CM | POA: Diagnosis not present

## 2019-01-21 DIAGNOSIS — M1990 Unspecified osteoarthritis, unspecified site: Secondary | ICD-10-CM | POA: Diagnosis not present

## 2019-01-21 DIAGNOSIS — F0390 Unspecified dementia without behavioral disturbance: Secondary | ICD-10-CM | POA: Diagnosis not present

## 2019-01-31 DIAGNOSIS — R82998 Other abnormal findings in urine: Secondary | ICD-10-CM | POA: Diagnosis not present

## 2019-01-31 DIAGNOSIS — F0280 Dementia in other diseases classified elsewhere without behavioral disturbance: Secondary | ICD-10-CM | POA: Diagnosis not present

## 2019-01-31 DIAGNOSIS — R531 Weakness: Secondary | ICD-10-CM | POA: Diagnosis not present

## 2019-01-31 DIAGNOSIS — H6123 Impacted cerumen, bilateral: Secondary | ICD-10-CM | POA: Diagnosis not present

## 2019-01-31 DIAGNOSIS — R000 Tachycardia, unspecified: Secondary | ICD-10-CM | POA: Diagnosis not present

## 2019-01-31 DIAGNOSIS — F418 Other specified anxiety disorders: Secondary | ICD-10-CM | POA: Diagnosis not present

## 2019-01-31 DIAGNOSIS — G301 Alzheimer's disease with late onset: Secondary | ICD-10-CM | POA: Diagnosis not present

## 2019-01-31 DIAGNOSIS — I10 Essential (primary) hypertension: Secondary | ICD-10-CM | POA: Diagnosis not present

## 2019-02-03 DIAGNOSIS — I10 Essential (primary) hypertension: Secondary | ICD-10-CM | POA: Diagnosis not present

## 2019-02-03 DIAGNOSIS — R000 Tachycardia, unspecified: Secondary | ICD-10-CM | POA: Diagnosis not present

## 2019-02-05 DIAGNOSIS — R413 Other amnesia: Secondary | ICD-10-CM | POA: Diagnosis not present

## 2019-02-10 DIAGNOSIS — F0390 Unspecified dementia without behavioral disturbance: Secondary | ICD-10-CM | POA: Diagnosis not present

## 2019-02-10 DIAGNOSIS — R413 Other amnesia: Secondary | ICD-10-CM | POA: Diagnosis not present

## 2019-02-10 DIAGNOSIS — G4700 Insomnia, unspecified: Secondary | ICD-10-CM | POA: Diagnosis not present

## 2019-02-10 DIAGNOSIS — R451 Restlessness and agitation: Secondary | ICD-10-CM | POA: Diagnosis not present

## 2019-02-13 DIAGNOSIS — R000 Tachycardia, unspecified: Secondary | ICD-10-CM | POA: Diagnosis not present

## 2019-02-13 DIAGNOSIS — F0390 Unspecified dementia without behavioral disturbance: Secondary | ICD-10-CM | POA: Diagnosis not present

## 2019-02-13 DIAGNOSIS — B952 Enterococcus as the cause of diseases classified elsewhere: Secondary | ICD-10-CM | POA: Diagnosis not present

## 2019-02-13 DIAGNOSIS — N390 Urinary tract infection, site not specified: Secondary | ICD-10-CM | POA: Diagnosis not present

## 2019-02-13 DIAGNOSIS — I10 Essential (primary) hypertension: Secondary | ICD-10-CM | POA: Diagnosis not present

## 2019-02-26 DIAGNOSIS — N390 Urinary tract infection, site not specified: Secondary | ICD-10-CM | POA: Diagnosis not present

## 2019-02-27 DIAGNOSIS — N3000 Acute cystitis without hematuria: Secondary | ICD-10-CM | POA: Diagnosis not present
